# Patient Record
Sex: Female | Born: 1980 | Race: Black or African American | Hispanic: No | Marital: Single | Smoking: Current some day smoker
Health system: Southern US, Community
[De-identification: ages and names within clinical notes are randomized; demographics above are authoritative.]

## PROBLEM LIST (undated history)

## (undated) DIAGNOSIS — E669 Obesity, unspecified: Secondary | ICD-10-CM

---

## 2016-02-12 ENCOUNTER — Encounter (HOSPITAL_COMMUNITY): Payer: Self-pay | Admitting: *Deleted

## 2016-02-12 ENCOUNTER — Emergency Department (HOSPITAL_COMMUNITY)
Admission: EM | Admit: 2016-02-12 | Discharge: 2016-02-12 | Disposition: A | Discharge status: Home / Self Care | Payer: Medicaid Other | Attending: Emergency Medicine | Admitting: Emergency Medicine

## 2016-02-12 ENCOUNTER — Emergency Department (HOSPITAL_COMMUNITY): Payer: Medicaid Other

## 2016-02-12 DIAGNOSIS — Z720 Tobacco use: Secondary | ICD-10-CM

## 2016-02-12 DIAGNOSIS — F17200 Nicotine dependence, unspecified, uncomplicated: Secondary | ICD-10-CM | POA: Diagnosis not present

## 2016-02-12 DIAGNOSIS — Z3202 Encounter for pregnancy test, result negative: Secondary | ICD-10-CM | POA: Diagnosis not present

## 2016-02-12 DIAGNOSIS — J40 Bronchitis, not specified as acute or chronic: Secondary | ICD-10-CM

## 2016-02-12 DIAGNOSIS — R079 Chest pain, unspecified: Secondary | ICD-10-CM | POA: Insufficient documentation

## 2016-02-12 DIAGNOSIS — J209 Acute bronchitis, unspecified: Secondary | ICD-10-CM | POA: Diagnosis not present

## 2016-02-12 LAB — BASIC METABOLIC PANEL
ANION GAP: 7 (ref 5–15)
BUN: 7 mg/dL (ref 6–20)
CO2: 25 mmol/L (ref 22–32)
Calcium: 9.4 mg/dL (ref 8.9–10.3)
Chloride: 105 mmol/L (ref 101–111)
Creatinine, Ser: 0.75 mg/dL (ref 0.44–1.00)
GFR calc Af Amer: >60 mL/min (ref >60)
Glucose, Bld: 111 mg/dL — ABNORMAL HIGH (ref 65–99)
POTASSIUM: 3.8 mmol/L (ref 3.5–5.1)
SODIUM: 137 mmol/L (ref 135–145)

## 2016-02-12 LAB — CBC
HEMATOCRIT: 38.8 % (ref 36.0–46.0)
HEMOGLOBIN: 12 g/dL (ref 12.0–15.0)
MCH: 26.5 pg (ref 26.0–34.0)
MCHC: 30.9 g/dL (ref 30.0–36.0)
MCV: 85.7 fL (ref 78.0–100.0)
Platelets: 231 10*3/uL (ref 150–400)
RBC: 4.53 MIL/uL (ref 3.87–5.11)
RDW: 14.4 % (ref 11.5–15.5)
WBC: 6.9 10*3/uL (ref 4.0–10.5)

## 2016-02-12 LAB — URINALYSIS, ROUTINE W REFLEX MICROSCOPIC
Bilirubin Urine: NEGATIVE
Glucose, UA: NEGATIVE mg/dL
Hgb urine dipstick: NEGATIVE
KETONES UR: NEGATIVE mg/dL
LEUKOCYTES UA: NEGATIVE
NITRITE: NEGATIVE
PROTEIN: NEGATIVE mg/dL
Specific Gravity, Urine: 1.018 (ref 1.005–1.030)
pH: 6.5 (ref 5.0–8.0)

## 2016-02-12 LAB — I-STAT TROPONIN, ED: Troponin i, poc: 0.01 ng/mL (ref 0.00–0.08)

## 2016-02-12 LAB — RAPID URINE DRUG SCREEN, HOSP PERFORMED
Amphetamines: NOT DETECTED
BENZODIAZEPINES: NOT DETECTED
Barbiturates: NOT DETECTED
COCAINE: NOT DETECTED
Opiates: NOT DETECTED
Tetrahydrocannabinol: POSITIVE — AB

## 2016-02-12 LAB — TROPONIN I

## 2016-02-12 LAB — POC URINE PREG, ED: PREG TEST UR: NEGATIVE

## 2016-02-12 MED ORDER — KETOROLAC TROMETHAMINE 10 MG PO TABS: 10.0000 mg | ORAL_TABLET | Freq: Four times a day (QID) | ORAL | Status: DC | PRN

## 2016-02-12 MED ORDER — AZITHROMYCIN 250 MG PO TABS: 250.0000 mg | ORAL_TABLET | Freq: Every day | ORAL | Status: DC

## 2016-02-12 MED ORDER — AZITHROMYCIN 250 MG PO TABS
500.0000 mg | ORAL_TABLET | Freq: Once | ORAL | Status: AC
  Administered 2016-02-12: 500 mg via ORAL
  Filled 2016-02-12: qty 2

## 2016-02-12 NOTE — ED Provider Notes (Signed)
CSN: [id number]     Arrival date & time 02/12/16  1047 History   First MD Initiated Contact with Patient 02/12/16 1049     Chief Complaint  Patient presents with  . Chest Pain   PT HERE VIA EMS DUE TO CP.  THE PT SAID SHE HAS RECENTLY MOVED HERE FROM [place] AND HAS HAD A LOT OF STRESS.  THE PT SAID SHE HAS BEEN SMOKING MORE THAN NL.  THE PT WAS GIVEN ASA BY EMS.  (Consider location/radiation/quality/duration/timing/severity/associated sxs/prior Treatment) Patient is a 35 y.o. female presenting with chest pain. The history is provided by the patient.  Chest Pain Pain location:  R chest Pain radiates to:  R jaw Pain radiates to the back: no   Pain severity:  Moderate Onset quality:  Sudden Progression:  Resolved Chronicity:  New Context: breathing     No past medical history on file. No past surgical history on file. No family history on file. Social History  Substance Use Topics  . Smoking status: Not on file  . Smokeless tobacco: Not on file  . Alcohol Use: Not on file   OB History    No data available     Review of Systems  Cardiovascular: Positive for chest pain.  All other systems reviewed and are negative.     Allergies  Review of patient's allergies indicates no known allergies.  Home Medications   Prior to Admission medications   Medication Sig Start Date End Date Taking? Authorizing Provider  azithromycin (ZITHROMAX Z-PAK) 250 MG tablet Take 1 tablet (250 mg total) by mouth daily. 02/12/16   Jacalyn LefevreJulie Claris Guymon, MD  ketorolac (TORADOL) 10 MG tablet Take 1 tablet (10 mg total) by mouth every 6 (six) hours as needed. 02/12/16   Jacalyn LefevreJulie Madden Garron, MD   BP 107/61 mmHg  Pulse 63  Temp(Src) 98.3 F (36.8 C) (Oral)  Resp 18  Ht 5\' 1"  (1.549 m)  Wt 272 lb (123.378 kg)  BMI 51.42 kg/m2  SpO2 100% Physical Exam  Constitutional: She is oriented to person, place, and time. She appears well-developed and well-nourished.  HENT:  Head: Normocephalic and atraumatic.   Right Ear: External ear normal.  Left Ear: External ear normal.  Nose: Nose normal.  Mouth/Throat: Oropharynx is clear and moist.  Eyes: Conjunctivae and EOM are normal. Pupils are equal, round, and reactive to light.  Neck: Normal range of motion. Neck supple.  Cardiovascular: Normal rate, regular rhythm, normal heart sounds and intact distal pulses.   Pulmonary/Chest: Effort normal and breath sounds normal.  Abdominal: Soft. Bowel sounds are normal.  Musculoskeletal: Normal range of motion.  Neurological: She is alert and oriented to person, place, and time.  Skin: Skin is warm and dry.  Psychiatric: She has a normal mood and affect. Her behavior is normal. Judgment and thought content normal.  Nursing note and vitals reviewed.   ED Course  Procedures (including critical care time) Labs Review Labs Reviewed  BASIC METABOLIC PANEL - Abnormal; Notable for the following:    Glucose, Bld 111 (*)    All other components within normal limits  URINE RAPID DRUG SCREEN, HOSP PERFORMED - Abnormal; Notable for the following:    Tetrahydrocannabinol POSITIVE (*)    All other components within normal limits  CBC  TROPONIN I  URINALYSIS, ROUTINE W REFLEX MICROSCOPIC (NOT [facility])  Rosezena SensorI-STAT TROPOININ, ED  POC URINE PREG, ED    Imaging Review Dg Chest 2 View  02/12/2016  CLINICAL DATA:  Right-sided chest  pain radiating up into neck and jaw 2 weeks. EXAM: CHEST  2 VIEW COMPARISON:  None. FINDINGS: Lungs are adequately inflated without focal consolidation or effusion. Minimal prominence of the central bronchovascular markings. Borderline cardiomegaly. Mild degenerate change of the spine. IMPRESSION: Subtle prominence of the central bronchovascular markings which may be due to an acute bronchitic process or possible mild vascular congestion. Borderline cardiomegaly. Electronically Signed   By: Elberta Fortis M.D.   On: 02/12/2016 12:05   I have personally reviewed and evaluated these images and  lab results as part of my medical decision-making.   EKG Interpretation   Date/Time:  Friday Feb 12 2016 11:12:18 EDT Ventricular Rate:  66 PR Interval:  170 QRS Duration: 98 QT Interval:  415 QTC Calculation: 435 R Axis:   86 Text Interpretation:  Sinus rhythm Low voltage, precordial leads Baseline  wander in lead(s) V2 Confirmed by Robi Mitter MD, Taydem Cavagnaro ([id number]) on 02/12/2016  11:45:16 AM      MDM  PT IS FEELING BETTER.  SHE IS TOLD TO STOP SMOKING AND TO WATCH HER DIET AND EXERCISE.  SHE IS INSTR TO RETURN IF WORSE. Final diagnoses:  Chest pain, unspecified chest pain type  Tobacco abuse  Bronchitis        Jacalyn Lefevre, MD 02/12/16 1229

## 2016-02-12 NOTE — ED Notes (Signed)
[facility] in [facility], per report pt reports R CP radiates to R jaw, pt denies n/v/d & SOB, A&O x4, pt 324 mg ASA, pain free upon EMS arrival & at arrival to ED

## 2016-02-12 NOTE — Discharge Instructions (Signed)
Chest Wall Pain °Chest wall pain is pain in or around the bones and muscles of your chest. Sometimes, an injury causes this pain. Sometimes, the cause may not be known. This pain may take several weeks or longer to get better. °HOME CARE °Pay attention to any changes in your symptoms. Take these actions to help with your pain: °· Rest as told by your doctor. °· Avoid activities that cause pain. Try not to use your chest, belly (abdominal), or side muscles to lift heavy things. °· If directed, apply ice to the painful area: °¨ Put ice in a plastic bag. °¨ Place a towel between your skin and the bag. °¨ Leave the ice on for 20 minutes, 2-3 times per day. °· Take over-the-counter and prescription medicines only as told by your doctor. °· Do not use tobacco products, including cigarettes, chewing tobacco, and e-cigarettes. If you need help quitting, ask your doctor. °· Keep all follow-up visits as told by your doctor. This is important. °GET HELP IF: °· You have a fever. °· Your chest pain gets worse. °· You have new symptoms. °GET HELP RIGHT AWAY IF: °· You feel sick to your stomach (nauseous) or you throw up (vomit). °· You feel sweaty or light-headed. °· You have a cough with phlegm (sputum) or you cough up blood. °· You are short of breath. °  °This information is not intended to replace advice given to you by your health care provider. Make sure you discuss any questions you have with your health care provider. °  °Document Released: 02/22/2008 Document Revised: 05/27/2015 Document Reviewed: 12/01/2014 °Elsevier Interactive Patient Education ©2016 [employer] ° °

## 2016-02-21 ENCOUNTER — Encounter (HOSPITAL_COMMUNITY): Payer: Self-pay | Admitting: Nurse Practitioner

## 2016-02-21 ENCOUNTER — Emergency Department (HOSPITAL_COMMUNITY)
Admission: EM | Admit: 2016-02-21 | Discharge: 2016-02-21 | Disposition: A | Discharge status: Home / Self Care | Payer: Medicaid Other | Attending: Emergency Medicine | Admitting: Emergency Medicine

## 2016-02-21 DIAGNOSIS — L989 Disorder of the skin and subcutaneous tissue, unspecified: Secondary | ICD-10-CM

## 2016-02-21 DIAGNOSIS — Z6841 Body Mass Index (BMI) 40.0 and over, adult: Secondary | ICD-10-CM | POA: Diagnosis not present

## 2016-02-21 DIAGNOSIS — Y999 Unspecified external cause status: Secondary | ICD-10-CM | POA: Insufficient documentation

## 2016-02-21 DIAGNOSIS — S40862A Insect bite (nonvenomous) of left upper arm, initial encounter: Secondary | ICD-10-CM | POA: Diagnosis not present

## 2016-02-21 DIAGNOSIS — R220 Localized swelling, mass and lump, head: Secondary | ICD-10-CM | POA: Insufficient documentation

## 2016-02-21 DIAGNOSIS — Y929 Unspecified place or not applicable: Secondary | ICD-10-CM | POA: Diagnosis not present

## 2016-02-21 DIAGNOSIS — W57XXXA Bitten or stung by nonvenomous insect and other nonvenomous arthropods, initial encounter: Secondary | ICD-10-CM | POA: Diagnosis not present

## 2016-02-21 DIAGNOSIS — E669 Obesity, unspecified: Secondary | ICD-10-CM | POA: Insufficient documentation

## 2016-02-21 DIAGNOSIS — F17210 Nicotine dependence, cigarettes, uncomplicated: Secondary | ICD-10-CM | POA: Insufficient documentation

## 2016-02-21 DIAGNOSIS — Y939 Activity, unspecified: Secondary | ICD-10-CM | POA: Insufficient documentation

## 2016-02-21 DIAGNOSIS — Z79899 Other long term (current) drug therapy: Secondary | ICD-10-CM | POA: Insufficient documentation

## 2016-02-21 DIAGNOSIS — R21 Rash and other nonspecific skin eruption: Secondary | ICD-10-CM | POA: Diagnosis present

## 2016-02-21 MED ORDER — DIPHENHYDRAMINE HCL 25 MG PO TABS: 25.0000 mg | ORAL_TABLET | Freq: Four times a day (QID) | ORAL | Status: DC

## 2016-02-21 MED ORDER — FAMOTIDINE 20 MG PO TABS
20.0000 mg | ORAL_TABLET | Freq: Once | ORAL | Status: AC
  Administered 2016-02-21: 20 mg via ORAL
  Filled 2016-02-21: qty 1

## 2016-02-21 MED ORDER — DIPHENHYDRAMINE HCL 25 MG PO CAPS
50.0000 mg | ORAL_CAPSULE | Freq: Once | ORAL | Status: AC
  Administered 2016-02-21: 50 mg via ORAL
  Filled 2016-02-21: qty 2

## 2016-02-21 MED ORDER — PREDNISONE 10 MG (21) PO TBPK
10.0000 mg | ORAL_TABLET | Freq: Every day | ORAL | Status: DC
Start: 2016-02-21 — End: 2016-05-04

## 2016-02-21 MED ORDER — EPINEPHRINE 0.3 MG/0.3ML IJ SOAJ: 0.3000 mg | Freq: Once | INTRAMUSCULAR | Status: AC

## 2016-02-21 MED ORDER — FAMOTIDINE 20 MG PO TABS: 20.0000 mg | ORAL_TABLET | Freq: Two times a day (BID) | ORAL | Status: AC

## 2016-02-21 MED ORDER — PREDNISONE 20 MG PO TABS
60.0000 mg | ORAL_TABLET | Freq: Once | ORAL | Status: AC
  Administered 2016-02-21: 60 mg via ORAL
  Filled 2016-02-21: qty 3

## 2016-02-21 NOTE — ED Provider Notes (Signed)
CSN: [id number]     Arrival date & time 02/21/16  1645 History   First MD Initiated Contact with Patient 02/21/16 1705     Chief Complaint  Patient presents with  . Skin Problem   HPI  Felicia Yoder is a 35 y.o. female presenting with a 3 day history of painful tongue and a "rash" on her arm. She was seen in this ED on 5/26, was given a prescription for azithromycin. She began taking the azithromycin that day, had tongue swelling and noticed a red bite mark on her inner upper left arm that she describes as painful and itchy. She denies fevers, chills, chest pain, shortness of breath, signs of her throat closing, mucosal sloughing, nausea, vomiting, abdominal pain, change in bowel or bladder habits.  History reviewed. No pertinent past medical history. History reviewed. No pertinent past surgical history. History reviewed. No pertinent family history. Social History  Substance Use Topics  . Smoking status: Current Every Day Smoker -- 1.00 packs/day    Types: Cigarettes  . Smokeless tobacco: None  . Alcohol Use: No   OB History    No data available     Review of Systems  Ten systems are reviewed and are negative for acute change except as noted in the HPI  Allergies  Azithromycin  Home Medications   Prior to Admission medications   Medication Sig Start Date End Date Taking? Authorizing Provider  azithromycin (ZITHROMAX Z-PAK) 250 MG tablet Take 1 tablet (250 mg total) by mouth daily. 02/12/16   Jacalyn Lefevre, MD  EPINEPHrine (EPIPEN 2-PAK) 0.3 mg/0.3 mL IJ SOAJ injection Inject 0.3 mLs (0.3 mg total) into the muscle once. 02/21/16   Melton Krebs, PA-C  ketorolac (TORADOL) 10 MG tablet Take 1 tablet (10 mg total) by mouth every 6 (six) hours as needed. 02/12/16   Jacalyn Lefevre, MD   BP 121/75 mmHg  Pulse 88  Temp(Src) 97.9 F (36.6 C) (Oral)  Resp 16  Ht  (1.549 m)  Wt 122.641 kg  BMI 51.11 kg/m2  SpO2 96% Physical Exam  Constitutional: She appears  well-developed and well-nourished. No distress.  Obese  HENT:  Head: Normocephalic and atraumatic.  Mouth/Throat: Oropharynx is clear and moist. No oropharyngeal exudate.  Eyes: Conjunctivae are normal. Pupils are equal, round, and reactive to light. Right eye exhibits no discharge. Left eye exhibits no discharge. No scleral icterus.  Neck: No tracheal deviation present.  Cardiovascular: Normal rate, regular rhythm, normal heart sounds and intact distal pulses.  Exam reveals no gallop and no friction rub.   No murmur heard. Pulmonary/Chest: Effort normal and breath sounds normal. No respiratory distress. She has no wheezes. She has no rales. She exhibits no tenderness.  Abdominal: Soft. Bowel sounds are normal. She exhibits no distension and no mass. There is no tenderness. There is no rebound and no guarding.  Musculoskeletal: She exhibits no edema.  Lymphadenopathy:    She has no cervical adenopathy.  Neurological: She is alert. Coordination normal.  Skin: Skin is warm and dry. No rash noted. She is not diaphoretic. There is erythema.  1 cm of erythema at left medial upper arm. No fluctuance, purulent drainage. Lesion consistent with healing insect bite  Psychiatric: She has a normal mood and affect. Her behavior is normal.  Nursing note and vitals reviewed.   ED Course  Procedures   MDM   Final diagnoses:  Tongue swelling  Skin lesion   Patient with painful and swollen tongue for 2-3 days.  It has not changed in size and she denies any feelings of shortness of breath. No mucosal sloughing on exam.  Patient denies any difficulty breathing or swallowing.  Pt has a patent airway without stridor and is handling secretions without difficulty; no angioedema. No blisters, no pustules, no warmth, no draining sinus tracts, no superficial abscesses, no bullous impetigo, no vesicles, no desquamation, no target lesions with dusky purpura or a central bulla. Not tender to touch. No concern for  superimposed infection. No concern for SJS, TEN, TSS, tick borne illness, syphilis or other life-threatening condition. Will discharge home with short course of steroids, pepcid and recommend Benadryl as needed for pruritis.  Patient re-evaluated prior to dc, is hemodynamically stable, in no respiratory distress, and denies the feeling of throat closing. Pt has been advised to take OTC benadryl & return to the ED if they have a mod-severe allergic rxn (s/s including throat closing, difficulty breathing, swelling of lips face or tongue). Pt is to follow up with their PCP. Pt is agreeable with plan & verbalizes understanding. Patient given an EpiPen at discharge as well as a coupon.   Melton KrebsSamantha Nicole Deshanta Lady, PA-C 02/21/16 1731  Gerhard Munchobert Lockwood, MD 02/21/16 [id number]

## 2016-02-21 NOTE — ED Notes (Addendum)
Pt c/o painful rash to tongue, onset after she was treated with antibiotics for bronchitis. She describes the rash as burning. She also c/o red bite mark to inner upper arm x 2-3 days that is painful and itchy. She is alert and breathing easily, denies any pain now

## 2016-02-21 NOTE — Discharge Instructions (Signed)
Ms. Nicola PoliceRuby Azzarello,  Nice meeting you! Please follow-up with your primary care provider. Return to the emergency department if you develop chest pain, shortness of breath, throat closing/itching, new/worsening symptoms. Feel better soon!  S. Lane HackerNicole Bentzion Dauria, PA-C

## 2016-02-21 NOTE — ED Notes (Signed)
Bug bite to Lt upper arm. Site is dry and intact.

## 2016-02-21 NOTE — ED Notes (Signed)
Declined W/C at D/C and was escorted to lobby by RN. 

## 2016-02-25 ENCOUNTER — Emergency Department (HOSPITAL_COMMUNITY)
Admission: EM | Admit: 2016-02-25 | Discharge: 2016-02-25 | Disposition: A | Discharge status: Home / Self Care | Payer: Medicaid Other | Attending: Emergency Medicine | Admitting: Emergency Medicine

## 2016-02-25 ENCOUNTER — Encounter (HOSPITAL_COMMUNITY): Payer: Self-pay | Admitting: Emergency Medicine

## 2016-02-25 DIAGNOSIS — J028 Acute pharyngitis due to other specified organisms: Secondary | ICD-10-CM | POA: Diagnosis not present

## 2016-02-25 DIAGNOSIS — F17210 Nicotine dependence, cigarettes, uncomplicated: Secondary | ICD-10-CM | POA: Insufficient documentation

## 2016-02-25 DIAGNOSIS — R52 Pain, unspecified: Secondary | ICD-10-CM

## 2016-02-25 DIAGNOSIS — Z79899 Other long term (current) drug therapy: Secondary | ICD-10-CM | POA: Diagnosis not present

## 2016-02-25 DIAGNOSIS — J029 Acute pharyngitis, unspecified: Secondary | ICD-10-CM | POA: Diagnosis present

## 2016-02-25 DIAGNOSIS — R197 Diarrhea, unspecified: Secondary | ICD-10-CM

## 2016-02-25 DIAGNOSIS — B9789 Other viral agents as the cause of diseases classified elsewhere: Secondary | ICD-10-CM | POA: Diagnosis not present

## 2016-02-25 LAB — RAPID STREP SCREEN (MED CTR MEBANE ONLY): STREPTOCOCCUS, GROUP A SCREEN (DIRECT): NEGATIVE

## 2016-02-25 NOTE — ED Notes (Signed)
Pt c/o sore throat, body aches and diarrhea since last night.

## 2016-02-25 NOTE — ED Provider Notes (Signed)
CSN: [id number]     Arrival date & time 02/25/16  1506 History  By signing my name below, I, Felicia Yoder, attest that this documentation has been prepared under the direction and in the presence of Kerrie Buffalo, NP. Electronically Signed: Tanda Yoder, ED Scribe. 02/25/2016. 3:30 PM.  Chief Complaint  Patient presents with  . Generalized Body Aches   Patient is a 35 y.o. female presenting with pharyngitis. The history is provided by the patient. No language interpreter was used.  Sore Throat This is a new problem. The current episode started 12 to 24 hours ago. The problem occurs rarely. The problem has not changed since onset.The symptoms are aggravated by swallowing. She has tried nothing for the symptoms.   HPI Comments: Felicia Yoder is a 35 y.o. female who presents to the Emergency Department complaining of a 10/10, achy, sore throat with onset last night. Pain is exacerbated with swallowing but pt is able to tolerate own secretions. She reports associated diarrhea with loose stool and body aches. She reports she has never had similar symptoms. Pt has not taken anything for her symptoms. She denies fever, chill, nausea, vomiting, and constipation.  Per chart review: Pt was seen in ED on 02/12/2016 (approximately 2 weeks ago) for chest pain. She had an x ray done which showed findings of bronchitis. Pt was discharged home with prescriptions for Toradol and Zithromax. She was seen again in the ED on 02/21/2016 for tongue swelling that began a couple of days after taking the Zithromax. Pt was unsure if the antibiotic was causing reaction. She was given Pepcid, Benadryl, Prednisone, and an Epi pen on discharge but did not fill these prescriptions.   History reviewed. No pertinent past medical history. History reviewed. No pertinent past surgical history. No family history on file. Social History  Substance Use Topics  . Smoking status: Current Every Day Smoker -- 1.00 packs/day    Types:  Cigarettes  . Smokeless tobacco: None  . Alcohol Use: No   OB History    No data available     Review of Systems  Constitutional: Negative for fever and chills.  HENT: Positive for sore throat.   Gastrointestinal: Positive for diarrhea. Negative for nausea, vomiting and constipation.  All other systems reviewed and are negative.  Allergies  Azithromycin  Home Medications   Prior to Admission medications   Medication Sig Start Date End Date Taking? Authorizing Provider  azithromycin (ZITHROMAX Z-PAK) 250 MG tablet Take 1 tablet (250 mg total) by mouth daily. 02/12/16   Jacalyn Lefevre, MD  diphenhydrAMINE (BENADRYL) 25 MG tablet Take 1 tablet (25 mg total) by mouth every 6 (six) hours. 02/21/16   Melton Krebs, PA-C  EPINEPHrine (EPIPEN 2-PAK) 0.3 mg/0.3 mL IJ SOAJ injection Inject 0.3 mLs (0.3 mg total) into the muscle once. 02/21/16   Melton Krebs, PA-C  famotidine (PEPCID) 20 MG tablet Take 1 tablet (20 mg total) by mouth 2 (two) times daily. 02/21/16   Melton Krebs, PA-C  ketorolac (TORADOL) 10 MG tablet Take 1 tablet (10 mg total) by mouth every 6 (six) hours as needed. 02/12/16   Jacalyn Lefevre, MD  predniSONE (STERAPRED UNI-PAK 21 TAB) 10 MG (21) TBPK tablet Take 1 tablet (10 mg total) by mouth daily. Take 6 tabs by mouth daily  for 2 days, then 5 tabs for 2 days, then 4 tabs for 2 days, then 3 tabs for 2 days, 2 tabs for 2 days, then 1 tab by mouth daily  for 2 days 02/21/16   Melton KrebsSamantha Nicole Riley, PA-C   Triage Vitals: BP 124/61 mmHg  Pulse 85  Temp(Src) 98.2 F (36.8 C) (Oral)  Resp 16  Ht 5\' 1"  (1.549 m)  Wt 270 lb (122.471 kg)  BMI 51.04 kg/m2  SpO2 99%  LMP 01/21/2016   Physical Exam  Constitutional: She is oriented to person, place, and time. She appears well-developed and well-nourished. No distress.  HENT:  Head: Normocephalic and atraumatic.  Right Ear: Tympanic membrane normal.  Left Ear: Tympanic membrane normal.  Nose: Right sinus  exhibits no maxillary sinus tenderness and no frontal sinus tenderness. Left sinus exhibits no maxillary sinus tenderness and no frontal sinus tenderness.  Mouth/Throat: Uvula is midline. Posterior oropharyngeal erythema present. No posterior oropharyngeal edema.  No sinus tenderness No pharyngeal edema Mild pharyngeal erythema  Eyes: Conjunctivae and EOM are normal. Pupils are equal, round, and reactive to light.  Good ocular movement. Sclera clear.   Neck: Neck supple. No tracheal deviation present.  No cervical nodes palpated  Cardiovascular: Normal rate, regular rhythm and normal heart sounds.   Pulmonary/Chest: Effort normal and breath sounds normal. No respiratory distress. She has no wheezes. She has no rales.  Abdominal: Soft. There is no tenderness.  No CVA tenderness  Musculoskeletal: Normal range of motion.  Lymphadenopathy:    She has no cervical adenopathy.  Neurological: She is alert and oriented to person, place, and time.  Skin: Skin is warm and dry.  Psychiatric: She has a normal mood and affect. Her behavior is normal.  Nursing note and vitals reviewed.   ED Course  Procedures (including critical care time) DIAGNOSTIC STUDIES: Oxygen Saturation is 99% on RA, normal by my interpretation.    COORDINATION OF CARE: 3:24 PM-Discussed treatment plan which includes rapid strep test with pt at bedside and pt agreed to plan.   Labs Review Labs Reviewed  RAPID STREP SCREEN (NOT AT [facility])  CULTURE, GROUP A STREP [facility])    Imaging Review No results found. I have personally reviewed and evaluated the lab results as part of my medical decision-making.   MDM   Final diagnoses:  Viral sore throat  Body aches  Diarrhea, unspecified type   Pt with negative strep. Diagnosis of viral pharyngitis. No abx indicated at this time. Discussed that results of strep culture are pending and patient will be informed if positive result and abx will be called in at that time.  Discharge with symptomatic tx. No evidence of dehydration. Pt is tolerating secretions. Presentation not concerning for peritonsillar abscess or spread of infection to deep spaces of the throat; patent airway. Specific return precautions discussed. Recommended PCP follow up. Pt appears safe for discharge.  I personally performed the services described in this documentation, which was scribed in my presence. The recorded information has been reviewed and is accurate.   McCauslandHope M Izsak Meir, NP 02/26/16 1336  Geoffery Lyonsouglas Delo, MD 02/26/16 [phone]

## 2016-02-25 NOTE — Discharge Instructions (Signed)
Your strep screen is negative. We will send it for culture. We will only call if the culture is positive.  Use salt water gargles for the sore throat and take tylenol and ibuprofen for pain. Follow up with your doctor. Stay on clear liquids today and then advance to the diet for diarrhea.

## 2016-02-27 LAB — CULTURE, GROUP A STREP (THRC)

## 2016-03-09 ENCOUNTER — Encounter (HOSPITAL_COMMUNITY): Payer: Self-pay | Admitting: Emergency Medicine

## 2016-03-09 ENCOUNTER — Emergency Department (HOSPITAL_COMMUNITY)
Admission: EM | Admit: 2016-03-09 | Discharge: 2016-03-09 | Disposition: A | Discharge status: Home / Self Care | Payer: Medicaid Other | Attending: Emergency Medicine | Admitting: Emergency Medicine

## 2016-03-09 DIAGNOSIS — J029 Acute pharyngitis, unspecified: Secondary | ICD-10-CM | POA: Insufficient documentation

## 2016-03-09 DIAGNOSIS — Z79899 Other long term (current) drug therapy: Secondary | ICD-10-CM | POA: Diagnosis not present

## 2016-03-09 DIAGNOSIS — F17210 Nicotine dependence, cigarettes, uncomplicated: Secondary | ICD-10-CM | POA: Diagnosis not present

## 2016-03-09 LAB — RAPID STREP SCREEN (MED CTR MEBANE ONLY): STREPTOCOCCUS, GROUP A SCREEN (DIRECT): NEGATIVE

## 2016-03-09 MED ORDER — CHLORHEXIDINE GLUCONATE 0.12 % MT SOLN: 15.0000 mL | Freq: Two times a day (BID) | OROMUCOSAL | Status: DC

## 2016-03-09 MED ORDER — KETOROLAC TROMETHAMINE 10 MG PO TABS: 10.0000 mg | ORAL_TABLET | Freq: Four times a day (QID) | ORAL | Status: DC | PRN

## 2016-03-09 MED ORDER — IBUPROFEN 400 MG PO TABS
800.0000 mg | ORAL_TABLET | Freq: Once | ORAL | Status: AC
  Administered 2016-03-09: 800 mg via ORAL
  Filled 2016-03-09: qty 2

## 2016-03-09 NOTE — ED Notes (Signed)
Pt c/o of sore throat since yesterday worse this afternoon. Pt states hurts to swallow and talk.

## 2016-03-09 NOTE — ED Provider Notes (Signed)
CSN: [id number]     Arrival date & time 03/09/16  2013 History  By signing my name below, I, Soijett Blue, attest that this documentation has been prepared under the direction and in the presence of Fayrene HelperBowie Ganon Demasi, PA-C Electronically Signed: Soijett Blue, ED Scribe. 03/09/2016. 9:31 PM.   Chief Complaint  Patient presents with  . Sore Throat      The history is provided by the patient. No language interpreter was used.    HPI Comments: Felicia Yoder is a 35 y.o. female who presents to the Emergency Department complaining of constant, moderate, left sided sore throat onset yesterday worsening this afternoon. Pt reports that her sore throat is worsened with talking and swallowing. Denies any alleviating factors. She states that she is having associated symptoms of painful swallowing. She states that she has not tried any medications for the relief for her symptoms. She denies rhinorrhea, congestion, trouble swallowing, ear pain, decreased hearing, drooling, and any other symptoms. Pt is allergic to zithromax.    History reviewed. No pertinent past medical history. History reviewed. No pertinent past surgical history. No family history on file. Social History  Substance Use Topics  . Smoking status: Current Every Day Smoker -- 1.00 packs/day    Types: Cigarettes  . Smokeless tobacco: None  . Alcohol Use: No   OB History    No data available     Review of Systems  Constitutional: Negative for fever and chills.  HENT: Positive for sore throat. Negative for congestion, drooling, ear pain, hearing loss, rhinorrhea and trouble swallowing.        Painful swallowing      Allergies  Azithromycin  Home Medications   Prior to Admission medications   Medication Sig Start Date End Date Taking? Authorizing Provider  azithromycin (ZITHROMAX Z-PAK) 250 MG tablet Take 1 tablet (250 mg total) by mouth daily. 02/12/16   Jacalyn LefevreJulie Haviland, MD  diphenhydrAMINE (BENADRYL) 25 MG tablet Take 1 tablet  (25 mg total) by mouth every 6 (six) hours. 02/21/16   Melton KrebsSamantha Nicole Riley, PA-C  EPINEPHrine (EPIPEN 2-PAK) 0.3 mg/0.3 mL IJ SOAJ injection Inject 0.3 mLs (0.3 mg total) into the muscle once. 02/21/16   Melton KrebsSamantha Nicole Riley, PA-C  famotidine (PEPCID) 20 MG tablet Take 1 tablet (20 mg total) by mouth 2 (two) times daily. 02/21/16   Melton KrebsSamantha Nicole Riley, PA-C  ketorolac (TORADOL) 10 MG tablet Take 1 tablet (10 mg total) by mouth every 6 (six) hours as needed. 02/12/16   Jacalyn LefevreJulie Haviland, MD  predniSONE (STERAPRED UNI-PAK 21 TAB) 10 MG (21) TBPK tablet Take 1 tablet (10 mg total) by mouth daily. Take 6 tabs by mouth daily  for 2 days, then 5 tabs for 2 days, then 4 tabs for 2 days, then 3 tabs for 2 days, 2 tabs for 2 days, then 1 tab by mouth daily for 2 days 02/21/16   Melton KrebsSamantha Nicole Riley, PA-C   BP 110/71 mmHg  Pulse 100  Temp(Src) 98.4 F (36.9 C) (Oral)  Resp 20  Ht 5\' 1"  (1.549 m)  Wt 276 lb (125.193 kg)  BMI 52.18 kg/m2  SpO2 97%  LMP 02/24/2016 Physical Exam  Constitutional: She is oriented to person, place, and time. She appears well-developed and well-nourished. No distress.  HENT:  Head: Normocephalic and atraumatic.  Right Ear: Ear canal normal. Tympanic membrane is erythematous.  Left Ear: Tympanic membrane and ear canal normal.  Mouth/Throat: Uvula is midline, oropharynx is clear and moist and mucous membranes are normal.  No trismus in the jaw. No oropharyngeal exudate or posterior oropharyngeal edema.  Left TM nl. Right TM mildly erythematous. Nl canals bilaterally. No tonsillar enlargement or exudate. No trismus. Uvula midline. Tenderness noted to left mandibular region. No cervical LAD.   Eyes: EOM are normal.  Neck: Neck supple.  Cardiovascular: Normal rate.   Pulmonary/Chest: Effort normal. No respiratory distress.  Abdominal: She exhibits no distension.  Musculoskeletal: Normal range of motion.  Neurological: She is alert and oriented to person, place, and time.  Skin:  Skin is warm and dry.  Psychiatric: She has a normal mood and affect. Her behavior is normal.  Nursing note and vitals reviewed.   ED Course  Procedures (including critical care time) DIAGNOSTIC STUDIES: Oxygen Saturation is 97% on RA, nl by my interpretation.    COORDINATION OF CARE: 9:28 PM Discussed treatment plan with pt at bedside which includes rapid strep screen and culture, referral to ENT and pt agreed to plan.    Labs Review Labs Reviewed  RAPID STREP SCREEN (NOT AT [facility])  CULTURE, GROUP A STREP [facility])   I have personally reviewed and evaluated these lab results as part of my medical decision-making.   MDM   Final diagnoses:  Pharyngitis    Pt with negative strep. Diagnosis of viral pharyngitis. No abx indicated at this time. Discussed that results of strep culture are pending and patient will be informed if positive result and abx will be called in at that time. Will discharge with toradol and peridex and advise use of symptomatic tx. No evidence of dehydration. Pt is tolerating secretions. Presentation not concerning for peritonsillar abscess or spread of infection to deep spaces of the throat; patent airway. Referral and follow up with ENT if symptoms persist. Specific return precautions discussed. Recommended PCP follow up. Pt appears safe for discharge.  I personally performed the services described in this documentation, which was scribed in my presence. The recorded information has been reviewed and is accurate.      Fayrene Helper, PA-C 03/09/16 2153  Arby Barrette, MD 03/09/16 2325

## 2016-03-09 NOTE — Discharge Instructions (Signed)

## 2016-03-12 LAB — CULTURE, GROUP A STREP (THRC)

## 2016-05-04 ENCOUNTER — Encounter (HOSPITAL_COMMUNITY): Payer: Self-pay | Admitting: *Deleted

## 2016-05-04 ENCOUNTER — Emergency Department (HOSPITAL_COMMUNITY)
Admission: EM | Admit: 2016-05-04 | Discharge: 2016-05-04 | Disposition: A | Discharge status: Home / Self Care | Payer: Medicaid Other | Attending: Emergency Medicine | Admitting: Emergency Medicine

## 2016-05-04 DIAGNOSIS — F17210 Nicotine dependence, cigarettes, uncomplicated: Secondary | ICD-10-CM | POA: Insufficient documentation

## 2016-05-04 DIAGNOSIS — M545 Low back pain, unspecified: Secondary | ICD-10-CM

## 2016-05-04 DIAGNOSIS — M7918 Myalgia, other site: Secondary | ICD-10-CM

## 2016-05-04 DIAGNOSIS — S060X0D Concussion without loss of consciousness, subsequent encounter: Secondary | ICD-10-CM | POA: Insufficient documentation

## 2016-05-04 DIAGNOSIS — R110 Nausea: Secondary | ICD-10-CM

## 2016-05-04 DIAGNOSIS — S3992XD Unspecified injury of lower back, subsequent encounter: Secondary | ICD-10-CM | POA: Diagnosis present

## 2016-05-04 DIAGNOSIS — S39012D Strain of muscle, fascia and tendon of lower back, subsequent encounter: Secondary | ICD-10-CM | POA: Insufficient documentation

## 2016-05-04 HISTORY — DX: Obesity, unspecified: E66.9

## 2016-05-04 LAB — I-STAT BETA HCG BLOOD, ED (MC, WL, AP ONLY)

## 2016-05-04 MED ORDER — METOCLOPRAMIDE HCL 5 MG/ML IJ SOLN
10.0000 mg | Freq: Once | INTRAMUSCULAR | Status: AC
  Administered 2016-05-04: 10 mg via INTRAVENOUS
  Filled 2016-05-04: qty 2

## 2016-05-04 MED ORDER — OXYCODONE-ACETAMINOPHEN 5-325 MG PO TABS
1.0000 | ORAL_TABLET | ORAL | Status: AC | PRN
  Administered 2016-05-04 (×2): 1 via ORAL
  Filled 2016-05-04: qty 1

## 2016-05-04 MED ORDER — SODIUM CHLORIDE 0.9 % IV BOLUS (SEPSIS)
1000.0000 mL | Freq: Once | INTRAVENOUS | Status: AC
  Administered 2016-05-04: 1000 mL via INTRAVENOUS

## 2016-05-04 MED ORDER — OXYCODONE-ACETAMINOPHEN 5-325 MG PO TABS
ORAL_TABLET | ORAL | Status: AC
  Administered 2016-05-04: 1 via ORAL
  Filled 2016-05-04: qty 1

## 2016-05-04 MED ORDER — CYCLOBENZAPRINE HCL 10 MG PO TABS
10.0000 mg | ORAL_TABLET | Freq: Once | ORAL | Status: AC
  Administered 2016-05-04: 10 mg via ORAL
  Filled 2016-05-04: qty 1

## 2016-05-04 MED ORDER — DIPHENHYDRAMINE HCL 50 MG/ML IJ SOLN
25.0000 mg | Freq: Once | INTRAMUSCULAR | Status: AC
  Administered 2016-05-04: 25 mg via INTRAVENOUS
  Filled 2016-05-04: qty 1

## 2016-05-04 MED ORDER — KETOROLAC TROMETHAMINE 30 MG/ML IJ SOLN
30.0000 mg | Freq: Once | INTRAMUSCULAR | Status: AC
  Administered 2016-05-04: 30 mg via INTRAVENOUS
  Filled 2016-05-04: qty 1

## 2016-05-04 MED ORDER — METOCLOPRAMIDE HCL 10 MG PO TABS: 10.0000 mg | ORAL_TABLET | Freq: Four times a day (QID) | ORAL | 0 refills | Status: DC | PRN

## 2016-05-04 NOTE — ED Notes (Signed)
Pt walked to room from waiting. Assisted into gown. Pt tolerated well.

## 2016-05-04 NOTE — Discharge Instructions (Signed)
Take naprosyn (given at last ER visit) as directed for inflammation and pain with tylenol for breakthrough pain and flexeril (from other ER visit) for muscle relaxation. Do not drive or operate machinery with muscle relaxant use. Use heat to areas of soreness except your head on which you should use ice, no more than 20 minutes at a time every hour for each.  For your concussion, Get plenty of rest, use ice on your head. Stay in a quiet, not simulating, dark environment. No TV, computer use, video games, or cell phone use until headache is resolved completely. Follow Up with primary care physician in 3-4 days if headache persists.  Return to the emergency department if patient becomes lethargic, begins vomiting or other change in mental status. Expect to be sore for the next few days and follow up with primary care physician for recheck of ongoing symptoms in the next 1-2 weeks. Return to ER for emergent changing or worsening of symptoms.
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
## 2016-05-10 ENCOUNTER — Emergency Department (HOSPITAL_COMMUNITY)
Admission: EM | Admit: 2016-05-10 | Discharge: 2016-05-10 | Disposition: A | Discharge status: Home / Self Care | Payer: Medicaid Other | Attending: Emergency Medicine | Admitting: Emergency Medicine

## 2016-05-10 ENCOUNTER — Encounter (HOSPITAL_COMMUNITY): Payer: Self-pay | Admitting: Emergency Medicine

## 2016-05-10 ENCOUNTER — Emergency Department (HOSPITAL_COMMUNITY): Payer: Medicaid Other

## 2016-05-10 DIAGNOSIS — R519 Headache, unspecified: Secondary | ICD-10-CM

## 2016-05-10 DIAGNOSIS — R51 Headache: Secondary | ICD-10-CM | POA: Diagnosis present

## 2016-05-10 DIAGNOSIS — R112 Nausea with vomiting, unspecified: Secondary | ICD-10-CM | POA: Diagnosis not present

## 2016-05-10 DIAGNOSIS — F17210 Nicotine dependence, cigarettes, uncomplicated: Secondary | ICD-10-CM | POA: Insufficient documentation

## 2016-05-10 LAB — CBC
HEMATOCRIT: 45 % (ref 36.0–46.0)
HEMOGLOBIN: 14.6 g/dL (ref 12.0–15.0)
MCH: 27.9 pg (ref 26.0–34.0)
MCHC: 32.4 g/dL (ref 30.0–36.0)
MCV: 85.9 fL (ref 78.0–100.0)
Platelets: 217 10*3/uL (ref 150–400)
RBC: 5.24 MIL/uL — ABNORMAL HIGH (ref 3.87–5.11)
RDW: 14.4 % (ref 11.5–15.5)
WBC: 8 10*3/uL (ref 4.0–10.5)

## 2016-05-10 LAB — COMPREHENSIVE METABOLIC PANEL
ALBUMIN: 3.1 g/dL — AB (ref 3.5–5.0)
ALT: 20 U/L (ref 14–54)
ANION GAP: 6 (ref 5–15)
AST: 20 U/L (ref 15–41)
Alkaline Phosphatase: 52 U/L (ref 38–126)
BUN: 7 mg/dL (ref 6–20)
CHLORIDE: 109 mmol/L (ref 101–111)
CO2: 22 mmol/L (ref 22–32)
Calcium: 8.2 mg/dL — ABNORMAL LOW (ref 8.9–10.3)
Creatinine, Ser: 0.62 mg/dL (ref 0.44–1.00)
GFR calc Af Amer: >60 mL/min (ref >60)
Glucose, Bld: 111 mg/dL — ABNORMAL HIGH (ref 65–99)
POTASSIUM: 3.5 mmol/L (ref 3.5–5.1)
Sodium: 137 mmol/L (ref 135–145)
Total Bilirubin: 0.5 mg/dL (ref 0.3–1.2)
Total Protein: 5.7 g/dL — ABNORMAL LOW (ref 6.5–8.1)

## 2016-05-10 LAB — I-STAT BETA HCG BLOOD, ED (MC, WL, AP ONLY): I-stat hCG, quantitative: <5 m[IU]/mL (ref <5)

## 2016-05-10 LAB — LIPASE, BLOOD: LIPASE: 22 U/L (ref 11–51)

## 2016-05-10 MED ORDER — ONDANSETRON 4 MG PO TBDP
ORAL_TABLET | ORAL | Status: AC
  Filled 2016-05-10: qty 1

## 2016-05-10 MED ORDER — ONDANSETRON 4 MG PO TBDP
4.0000 mg | ORAL_TABLET | Freq: Once | ORAL | Status: AC | PRN
  Administered 2016-05-10: 4 mg via ORAL

## 2016-05-10 MED ORDER — SODIUM CHLORIDE 0.9 % IV BOLUS (SEPSIS)
1000.0000 mL | Freq: Once | INTRAVENOUS | Status: AC
  Administered 2016-05-10: 1000 mL via INTRAVENOUS

## 2016-05-10 MED ORDER — DIPHENHYDRAMINE HCL 50 MG/ML IJ SOLN
25.0000 mg | Freq: Once | INTRAMUSCULAR | Status: AC
  Administered 2016-05-10: 25 mg via INTRAVENOUS
  Filled 2016-05-10: qty 1

## 2016-05-10 MED ORDER — KETOROLAC TROMETHAMINE 15 MG/ML IJ SOLN
15.0000 mg | Freq: Once | INTRAMUSCULAR | Status: AC
  Administered 2016-05-10: 15 mg via INTRAVENOUS
  Filled 2016-05-10: qty 1

## 2016-05-10 MED ORDER — METOCLOPRAMIDE HCL 5 MG/ML IJ SOLN
10.0000 mg | Freq: Once | INTRAMUSCULAR | Status: AC
  Administered 2016-05-10: 10 mg via INTRAVENOUS
  Filled 2016-05-10: qty 2
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
## 2016-05-10 NOTE — Discharge Instructions (Signed)
You were seen today for headache. They CT scan of your head was negative for any acute abnormalities. Take over-the-counter ibuprofen as needed for headache. Limit the use of screens such as computers, telephones, television, reading as this can exacerbate your headache. Be sure to stay well hydrated by drinking plenty of water, at least eight 8oz glasses of water per day. Follow-up with your primary care provider or the [facility] within 2-3 days if your symptoms are not improving.  Return to the emergency department if you experience worsening or uncontrolled headache, uncontrolled vomiting, dizziness, you pass out, visual changes, fever, neck pain, neck stiffness, abdominal pain or any other concerning symptoms.
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
## 2016-05-10 NOTE — ED Notes (Signed)
This RN with E. Meeks, NT, assisted pt to bedside commode.  Pt reported sensation of room spinning, gait noted to be unsteady.  PA made aware.

## 2016-05-17 ENCOUNTER — Encounter (HOSPITAL_COMMUNITY): Payer: Self-pay | Admitting: Emergency Medicine

## 2016-05-17 ENCOUNTER — Emergency Department (HOSPITAL_COMMUNITY)
Admission: EM | Admit: 2016-05-17 | Discharge: 2016-05-17 | Disposition: A | Discharge status: Home / Self Care | Payer: Medicaid Other | Attending: Emergency Medicine | Admitting: Emergency Medicine

## 2016-05-17 ENCOUNTER — Emergency Department (HOSPITAL_COMMUNITY): Payer: Medicaid Other

## 2016-05-17 DIAGNOSIS — F0781 Postconcussional syndrome: Secondary | ICD-10-CM | POA: Insufficient documentation

## 2016-05-17 DIAGNOSIS — Y999 Unspecified external cause status: Secondary | ICD-10-CM | POA: Diagnosis not present

## 2016-05-17 DIAGNOSIS — Y939 Activity, unspecified: Secondary | ICD-10-CM | POA: Diagnosis not present

## 2016-05-17 DIAGNOSIS — F17210 Nicotine dependence, cigarettes, uncomplicated: Secondary | ICD-10-CM | POA: Diagnosis not present

## 2016-05-17 DIAGNOSIS — Y92410 Unspecified street and highway as the place of occurrence of the external cause: Secondary | ICD-10-CM | POA: Diagnosis not present

## 2016-05-17 DIAGNOSIS — M542 Cervicalgia: Secondary | ICD-10-CM | POA: Diagnosis not present

## 2016-05-17 MED ORDER — KETOROLAC TROMETHAMINE 15 MG/ML IJ SOLN
15.0000 mg | Freq: Once | INTRAMUSCULAR | Status: AC
  Administered 2016-05-17: 15 mg via INTRAVENOUS
  Filled 2016-05-17: qty 1

## 2016-05-17 MED ORDER — DIAZEPAM 5 MG/ML IJ SOLN
5.0000 mg | Freq: Once | INTRAMUSCULAR | Status: AC
  Administered 2016-05-17: 5 mg via INTRAVENOUS
  Filled 2016-05-17: qty 2

## 2016-05-17 MED ORDER — SODIUM CHLORIDE 0.9 % IV BOLUS (SEPSIS)
1000.0000 mL | Freq: Once | INTRAVENOUS | Status: AC
  Administered 2016-05-17: 1000 mL via INTRAVENOUS

## 2016-05-17 MED ORDER — PROCHLORPERAZINE EDISYLATE 5 MG/ML IJ SOLN
10.0000 mg | Freq: Once | INTRAMUSCULAR | Status: AC
  Administered 2016-05-17: 10 mg via INTRAVENOUS
  Filled 2016-05-17: qty 2

## 2016-05-17 MED ORDER — DIPHENHYDRAMINE HCL 50 MG/ML IJ SOLN
12.5000 mg | Freq: Once | INTRAMUSCULAR | Status: AC
  Administered 2016-05-17: 12.5 mg via INTRAVENOUS
  Filled 2016-05-17: qty 1

## 2016-05-17 NOTE — ED Notes (Signed)
Pt refused vitals. Pt stated she is angry for wait time for CT. This tech explained delay.
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
## 2016-05-17 NOTE — ED Notes (Signed)
Pt. Tearful stating she wants the IV out and she is in pain. EDP notified. Pt. Noted to have her C-collar on the side table.

## 2016-09-17 ENCOUNTER — Emergency Department (HOSPITAL_COMMUNITY)
Admission: EM | Admit: 2016-09-17 | Discharge: 2016-09-17 | Disposition: A | Discharge status: Home / Self Care | Payer: Medicaid Other | Attending: Emergency Medicine | Admitting: Emergency Medicine

## 2016-09-17 ENCOUNTER — Encounter (HOSPITAL_COMMUNITY): Payer: Self-pay | Admitting: Emergency Medicine

## 2016-09-17 DIAGNOSIS — S0101XA Laceration without foreign body of scalp, initial encounter: Secondary | ICD-10-CM

## 2016-09-17 DIAGNOSIS — Y999 Unspecified external cause status: Secondary | ICD-10-CM | POA: Diagnosis not present

## 2016-09-17 DIAGNOSIS — Y939 Activity, unspecified: Secondary | ICD-10-CM | POA: Diagnosis not present

## 2016-09-17 DIAGNOSIS — F17210 Nicotine dependence, cigarettes, uncomplicated: Secondary | ICD-10-CM | POA: Insufficient documentation

## 2016-09-17 DIAGNOSIS — Y929 Unspecified place or not applicable: Secondary | ICD-10-CM | POA: Diagnosis not present

## 2016-09-17 DIAGNOSIS — Z3A00 Weeks of gestation of pregnancy not specified: Secondary | ICD-10-CM | POA: Insufficient documentation

## 2016-09-17 DIAGNOSIS — Z79899 Other long term (current) drug therapy: Secondary | ICD-10-CM | POA: Diagnosis not present

## 2016-09-17 DIAGNOSIS — O9A219 Injury, poisoning and certain other consequences of external causes complicating pregnancy, unspecified trimester: Secondary | ICD-10-CM | POA: Insufficient documentation

## 2016-09-17 DIAGNOSIS — S0181XA Laceration without foreign body of other part of head, initial encounter: Secondary | ICD-10-CM | POA: Diagnosis not present

## 2016-09-17 MED ORDER — BACITRACIN ZINC 500 UNIT/GM EX OINT: 1.0000 "application " | TOPICAL_OINTMENT | Freq: Two times a day (BID) | CUTANEOUS | 0 refills | Status: DC

## 2016-09-17 MED ORDER — LIDOCAINE-EPINEPHRINE (PF) 2 %-1:200000 IJ SOLN
10.0000 mL | Freq: Once | INTRAMUSCULAR | Status: AC
  Administered 2016-09-17: 10 mL
  Filled 2016-09-17: qty 20
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
## 2016-09-17 NOTE — Discharge Instructions (Signed)
Sutures will dissolve on it's own in 2-3 weeks..  Please read the instructions provided on wound care. Keep the area clean and dry, apply bacitracin ointment daily and take the medications provided. RETURN TO THE ER IF THERE IS INCREASED PAIN, REDNESS, PUS COMING OUT from the wound site.
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
## 2016-09-17 NOTE — ED Notes (Signed)
ED Provider at bedside. 

## 2017-01-13 ENCOUNTER — Emergency Department (HOSPITAL_COMMUNITY): Payer: Medicaid Other

## 2017-01-13 ENCOUNTER — Encounter (HOSPITAL_COMMUNITY): Payer: Self-pay | Admitting: Emergency Medicine

## 2017-01-13 ENCOUNTER — Emergency Department (HOSPITAL_COMMUNITY)
Admission: EM | Admit: 2017-01-13 | Discharge: 2017-01-13 | Disposition: A | Discharge status: Home / Self Care | Payer: Medicaid Other | Attending: Emergency Medicine | Admitting: Emergency Medicine

## 2017-01-13 DIAGNOSIS — F17210 Nicotine dependence, cigarettes, uncomplicated: Secondary | ICD-10-CM | POA: Diagnosis not present

## 2017-01-13 DIAGNOSIS — R102 Pelvic and perineal pain unspecified side: Secondary | ICD-10-CM

## 2017-01-13 DIAGNOSIS — R1012 Left upper quadrant pain: Secondary | ICD-10-CM | POA: Diagnosis present

## 2017-01-13 DIAGNOSIS — Z79899 Other long term (current) drug therapy: Secondary | ICD-10-CM | POA: Diagnosis not present

## 2017-01-13 DIAGNOSIS — R1031 Right lower quadrant pain: Secondary | ICD-10-CM

## 2017-01-13 DIAGNOSIS — R109 Unspecified abdominal pain: Secondary | ICD-10-CM

## 2017-01-13 DIAGNOSIS — B354 Tinea corporis: Secondary | ICD-10-CM

## 2017-01-13 LAB — COMPREHENSIVE METABOLIC PANEL
ALBUMIN: 3.6 g/dL (ref 3.5–5.0)
ALT: 30 U/L (ref 14–54)
ANION GAP: 7 (ref 5–15)
AST: 30 U/L (ref 15–41)
Alkaline Phosphatase: 69 U/L (ref 38–126)
BUN: 5 mg/dL — ABNORMAL LOW (ref 6–20)
CHLORIDE: 105 mmol/L (ref 101–111)
CO2: 25 mmol/L (ref 22–32)
Calcium: 8.8 mg/dL — ABNORMAL LOW (ref 8.9–10.3)
Creatinine, Ser: 0.66 mg/dL (ref 0.44–1.00)
GFR calc Af Amer: >60 mL/min (ref >60)
GFR calc non Af Amer: >60 mL/min (ref >60)
GLUCOSE: 108 mg/dL — AB (ref 65–99)
POTASSIUM: 3.5 mmol/L (ref 3.5–5.1)
SODIUM: 137 mmol/L (ref 135–145)
Total Bilirubin: 0.4 mg/dL (ref 0.3–1.2)
Total Protein: 6.9 g/dL (ref 6.5–8.1)

## 2017-01-13 LAB — I-STAT BETA HCG BLOOD, ED (MC, WL, AP ONLY): I-stat hCG, quantitative: <5 m[IU]/mL (ref <5)

## 2017-01-13 LAB — CBC
HEMATOCRIT: 38.2 % (ref 36.0–46.0)
HEMOGLOBIN: 12.6 g/dL (ref 12.0–15.0)
MCH: 27.9 pg (ref 26.0–34.0)
MCHC: 33 g/dL (ref 30.0–36.0)
MCV: 84.7 fL (ref 78.0–100.0)
Platelets: 244 10*3/uL (ref 150–400)
RBC: 4.51 MIL/uL (ref 3.87–5.11)
RDW: 14.3 % (ref 11.5–15.5)
WBC: 7.3 10*3/uL (ref 4.0–10.5)

## 2017-01-13 LAB — LIPASE, BLOOD: Lipase: 17 U/L (ref 11–51)

## 2017-01-13 MED ORDER — TERBINAFINE HCL 1 % EX CREA: 1.0000 "application " | TOPICAL_CREAM | Freq: Two times a day (BID) | CUTANEOUS | 0 refills | Status: AC

## 2017-01-13 NOTE — ED Provider Notes (Signed)
[facility]Y DEPT Provider Note   CSN: [id number] Arrival date & time: 01/13/17  1105     History   Chief Complaint Chief Complaint  Patient presents with  . Abdominal Pain  . Possible Pregnancy    HPI Felicia Yoder is a 36 y.o. female.  HPI  36 year old female presents today with numerous complaints.  Patient notes over the past several months she has not had her regular menstrual cycles.  She reports proximal 4 months ago she had normal bleeding for approximately 3-4 days.  She notes the subsequent months she has very light spotting for 1-2 days.  She has no spotting in between her normal menstrual cycles, no bleeding now.  She denies any vaginal discharge.  Patient also notes she is had intermittent left upper quadrant and right lower quadrant discomfort.  She describes this as "movement" in her belly as if she is pregnant.  She notes this comes and goes and is nonsevere.  She denies any fever, nausea vomiting or diarrhea.  She reports normal bowel movements.   Patient also notes a rash to her right trapezius circular in nature that has been there over the last week.  She describes as itchy no other rashes.  Past Medical History:  Diagnosis Date  . Obesity     There are no active problems to display for this patient.   History reviewed. No pertinent surgical history.  OB History    Gravida Para Term Preterm AB Living   1             SAB TAB Ectopic Multiple Live Births                   Home Medications    Prior to Admission medications   Medication Sig Start Date End Date Taking? Authorizing Provider  chlorhexidine (PERIDEX) 0.12 % solution Use as directed 15 mLs in the mouth or throat 2 (two) times daily. Patient not taking: Reported on 09/17/2016 03/09/16   Fayrene Helper, PA-C  diphenhydrAMINE (BENADRYL) 25 MG tablet Take 1 tablet (25 mg total) by mouth every 6 (six) hours. Patient not taking: Reported on 09/17/2016 02/21/16   Melton Krebs, PA-C    EPINEPHrine (EPIPEN 2-PAK) 0.3 mg/0.3 mL IJ SOAJ injection Inject 0.3 mLs (0.3 mg total) into the muscle once. Patient not taking: Reported on 01/13/2017 02/21/16   Melton Krebs, PA-C  famotidine (PEPCID) 20 MG tablet Take 1 tablet (20 mg total) by mouth 2 (two) times daily. Patient not taking: Reported on 09/17/2016 02/21/16   Melton Krebs, PA-C  metoCLOPramide (REGLAN) 10 MG tablet Take 1 tablet (10 mg total) by mouth every 6 (six) hours as needed for nausea (nausea/headache). Patient not taking: Reported on 09/17/2016 05/04/16   Mercedes Street, PA-C  terbinafine (LAMISIL AT) 1 % cream Apply 1 application topically 2 (two) times daily. 01/13/17   Eyvonne Mechanic, PA-C    Family History No family history on file.  Social History Social History  Substance Use Topics  . Smoking status: Current Every Day Smoker    Packs/day: 1.00    Types: Cigarettes  . Smokeless tobacco: Never Used  . Alcohol use No     Allergies   Azithromycin   Review of Systems Review of Systems  All other systems reviewed and are negative.    Physical Exam Updated Vital Signs BP 133/80 (BP Location: Right Arm)   Pulse 88   Temp 98.2 F (36.8 C)   Resp 18  LMP 01/11/2017   SpO2 99%   Breastfeeding? Unknown   Physical Exam  Constitutional: She is oriented to person, place, and time. She appears well-developed and well-nourished.  Morbidly obese  HENT:  Head: Normocephalic and atraumatic.  Eyes: Conjunctivae are normal. Pupils are equal, round, and reactive to light. Right eye exhibits no discharge. Left eye exhibits no discharge. No scleral icterus.  Neck: Normal range of motion. No JVD present. No tracheal deviation present.  Pulmonary/Chest: Effort normal. No stridor.  Abdominal:  Minor tenderness to palpation of left upper quadrant, minor tenderness palpation of right lower quadrant.  Remainder of abdominal exam is benign.  Neurological: She is alert and oriented to person,  place, and time. Coordination normal.  Skin:  Dry annular rash to the right trapezius  Psychiatric: She has a normal mood and affect. Her behavior is normal. Judgment and thought content normal.  Nursing note and vitals reviewed.    ED Treatments / Results  Labs (all labs ordered are listed, but only abnormal results are displayed) Labs Reviewed  COMPREHENSIVE METABOLIC PANEL - Abnormal; Notable for the following:       Result Value   Glucose, Bld 108 (*)    BUN 5 (*)    Calcium 8.8 (*)    All other components within normal limits  LIPASE, BLOOD  CBC  URINALYSIS, ROUTINE W REFLEX MICROSCOPIC  I-STAT BETA HCG BLOOD, ED ([facility], [facility], [facility] ONLY)    EKG  EKG Interpretation None       Radiology US Abdomen Complete  Result Date: 01/13/2017 CLINICAL DATA:  Right lower quadrant and left upper corrected pain for 3 weeks EXAM: ABDOMEN ULTRASOUND COMPLETE COMPARISON:  None. FINDINGS: Gallbladder: No gallstones or wall thickening visualized. No sonographic Murphy sign noted by sonographer. Common bile duct: Diameter: 3.4 mm Liver: Heterogenous increased and coarse echotexture. No focal hepatic abnormality. IVC: No abnormality visualized. Pancreas: Visualized portion unremarkable. Spleen: Size and appearance within normal limits. Right Kidney: Length: 13 cm. Echogenicity within normal limits. No mass or hydronephrosis visualized. Left Kidney: Length: 13.1 cm. Echogenicity within normal limits. No mass or hydronephrosis visualized. Abdominal aorta: Limited visualization due to habitus and bowel gas Other findings: None. IMPRESSION: 1. Coarse heterogeneous echogenic liver suspicious for fatty infiltration. 2. Otherwise negative examination. Electronically Signed   By: Jasmine Pang M.D.   On: 01/13/2017 15:28   US Transvaginal Non-ob  Result Date: 01/13/2017 CLINICAL DATA:  Right lower quadrant pain for 3 weeks EXAM: TRANSABDOMINAL AND TRANSVAGINAL ULTRASOUND OF PELVIS TECHNIQUE: Both transabdominal  and transvaginal ultrasound examinations of the pelvis were performed. Transabdominal technique was performed for global imaging of the pelvis including uterus, ovaries, adnexal regions, and pelvic cul-de-sac. It was necessary to proceed with endovaginal exam following the transabdominal exam to visualize the endometrium and ovaries. COMPARISON:  None FINDINGS: Uterus Measurements: 11.6 x 5.2 x 6 cm. Difficult visualization of the uterine fundus transvaginally, due to patient habitus. No obvious myometrial masses. Mildly complicated cyst in the cervix, measuring 1.6 x 1.5 x 1.6 cm, likely nabothian cyst. Endometrium Thickness: 4.3 mm.  No focal abnormality visualized. Right ovary Measurements: 2.4 x 2 x 1.8 cm. Normal appearance/no adnexal mass. Left ovary Measurements: 2.2 x 1.9 x 1.8 cm. Normal appearance/no adnexal mass. Other findings No abnormal free fluid. IMPRESSION: 1. The examination was compromised by patient habitus. 2. Negative pelvic ultrasound Electronically Signed   By: Jasmine Pang M.D.   On: 01/13/2017 15:24   US Pelvis Complete  Result Date: 01/13/2017 CLINICAL  DATA:  Right lower quadrant pain for 3 weeks EXAM: TRANSABDOMINAL AND TRANSVAGINAL ULTRASOUND OF PELVIS TECHNIQUE: Both transabdominal and transvaginal ultrasound examinations of the pelvis were performed. Transabdominal technique was performed for global imaging of the pelvis including uterus, ovaries, adnexal regions, and pelvic cul-de-sac. It was necessary to proceed with endovaginal exam following the transabdominal exam to visualize the endometrium and ovaries. COMPARISON:  None FINDINGS: Uterus Measurements: 11.6 x 5.2 x 6 cm. Difficult visualization of the uterine fundus transvaginally, due to patient habitus. No obvious myometrial masses. Mildly complicated cyst in the cervix, measuring 1.6 x 1.5 x 1.6 cm, likely nabothian cyst. Endometrium Thickness: 4.3 mm.  No focal abnormality visualized. Right ovary Measurements: 2.4 x 2 x  1.8 cm. Normal appearance/no adnexal mass. Left ovary Measurements: 2.2 x 1.9 x 1.8 cm. Normal appearance/no adnexal mass. Other findings No abnormal free fluid. IMPRESSION: 1. The examination was compromised by patient habitus. 2. Negative pelvic ultrasound Electronically Signed   By: Jasmine Pang M.D.   On: 01/13/2017 15:24    Procedures Procedures (including critical care time)  Medications Ordered in ED Medications - No data to display   Initial Impression / Assessment and Plan / ED Course  I have reviewed the triage vital signs and the nursing notes.  Pertinent labs & imaging results that were available during my care of the patient were reviewed by me and considered in my medical decision making (see chart for details).     Final Clinical Impressions(s) / ED Diagnoses   Final diagnoses:  Abdominal pain  LUQ pain  RLQ abdominal pain  Pelvic pain  Abdominal pain, unspecified abdominal location  Tinea corporis    Labs: I-STAT beta-hCG, lipase, CMP  Imaging: Ultrasound abdomen complete, ultrasound pelvis complete  Consults:  Therapeutics:  Discharge Meds: lamisil   Assessment/Plan: Patient presents with numerous complaints.  Her abdominal pain is intermittent and ongoing over the last several months.  She has an ultrasound here with no significant findings, and reassuring laboratory analysis, with reassuring vital signs.  I have low suspicion for acute intra-abdominal pathology in this patient.  Her vaginal bleeding is her menstrual cycles, lighter than normal, no bleeding throughout the month other than that her menstrual cycle.  She denies any vaginal discharge.  She will be followed up with OB/GYN.  Patient also has what appears to be ringworm on her shoulder.  She will be given Lamisil for this.  Patient's instructed follow-up with both OB/GYN and her primary care for reassessment and ongoing management.  She is given strict return precautions.  She verbalized  understanding and agreement to today's plan had no further questions or concerns.       New Prescriptions New Prescriptions   TERBINAFINE (LAMISIL AT) 1 % CREAM    Apply 1 application topically 2 (two) times daily.     Eyvonne Mechanic, PA-C 01/13/17 1553    Derwood Kaplan, MD 01/14/17 [phone]

## 2017-01-13 NOTE — ED Notes (Signed)
ED Provider at bedside. 

## 2017-01-13 NOTE — Discharge Instructions (Signed)
Please read attached information. If you experience any new or worsening signs or symptoms please return to the emergency room for evaluation. Please follow-up with your primary care provider or specialist as discussed. Please use medication prescribed only as directed and discontinue taking if you have any concerning signs or symptoms.   °

## 2017-01-13 NOTE — ED Notes (Signed)
Patient transported to US 

## 2017-01-13 NOTE — ED Triage Notes (Signed)
Pt reports for the past few months she has had vaginal bleeding off and on for 1-2 days, states she took a pregnancy test a few months ago that was negative but reports LLQ pain and "movement" in her belly.

## 2017-03-10 ENCOUNTER — Emergency Department (HOSPITAL_COMMUNITY)
Admission: EM | Admit: 2017-03-10 | Discharge: 2017-03-10 | Disposition: A | Discharge status: Home / Self Care | Payer: Medicaid Other | Attending: Emergency Medicine | Admitting: Emergency Medicine

## 2017-03-10 ENCOUNTER — Encounter (HOSPITAL_COMMUNITY): Payer: Self-pay | Admitting: Emergency Medicine

## 2017-03-10 DIAGNOSIS — F17210 Nicotine dependence, cigarettes, uncomplicated: Secondary | ICD-10-CM | POA: Diagnosis not present

## 2017-03-10 DIAGNOSIS — K0889 Other specified disorders of teeth and supporting structures: Secondary | ICD-10-CM | POA: Insufficient documentation

## 2017-03-10 MED ORDER — IBUPROFEN 800 MG PO TABS: 800.0000 mg | ORAL_TABLET | Freq: Three times a day (TID) | ORAL | 0 refills | Status: AC

## 2017-03-10 MED ORDER — HYDROCODONE-ACETAMINOPHEN 5-325 MG PO TABS
1.0000 | ORAL_TABLET | Freq: Once | ORAL | Status: AC
  Administered 2017-03-10: 1 via ORAL
  Filled 2017-03-10: qty 1

## 2017-03-10 MED ORDER — IBUPROFEN 400 MG PO TABS
ORAL_TABLET | ORAL | Status: DC
Start: 2017-03-10 — End: 2017-03-10
  Filled 2017-03-10: qty 1

## 2017-03-10 MED ORDER — IBUPROFEN 400 MG PO TABS: 400.0000 mg | ORAL_TABLET | Freq: Once | ORAL | Status: DC

## 2017-03-10 NOTE — Discharge Instructions (Signed)
As discussed, apply heat to the area. Ibuprofen for pain. Follow up with your dentist tomorrow.  Return if you experience difficulty breathing or swallowing in the meantime.

## 2017-03-10 NOTE — ED Triage Notes (Signed)
t presents to ED for assessment of right upper dental pain, ongoing x 6 months, but worsening tonight.  Patient has a dental appointment in [place] tomorrow, but states she cannot sleep tonight due to pain.  Patient has several teeth with missing chunks in that area.

## 2017-03-10 NOTE — ED Notes (Signed)
Pt c/o upper right dental pain. See providers assessment.

## 2017-03-10 NOTE — ED Notes (Signed)
Pt stable, understands discharge instructions, and reasons for return.   

## 2017-03-10 NOTE — ED Provider Notes (Signed)
[facility] Provider Note   CSN: [id number] Arrival date & time: 03/10/17  0005     History   Chief Complaint Chief Complaint  Patient presents with  . Dental Pain    HPI Felicia Yoder is a 36 y.o. female presenting with right upper molar pain onset a few hours prior to arrival. She explains that she's been having mental problems for months but has avoided the dentist because she doesn't like needles. She has tried Orajel without relief. She has an appointment tomorrow with the dentist but couldn't wait because of the pain being to severe. She denies fever, chills, difficulty swallowing, swelling, redness, or any other symptoms but pain.  HPI  Past Medical History:  Diagnosis Date  . Obesity     There are no active problems to display for this patient.   History reviewed. No pertinent surgical history.  OB History    Gravida Para Term Preterm AB Living   1             SAB TAB Ectopic Multiple Live Births                   Home Medications    Prior to Admission medications   Medication Sig Start Date End Date Taking? Authorizing Provider  chlorhexidine (PERIDEX) 0.12 % solution Use as directed 15 mLs in the mouth or throat 2 (two) times daily. Patient not taking: Reported on 09/17/2016 03/09/16   Fayrene Helper, PA-C  diphenhydrAMINE (BENADRYL) 25 MG tablet Take 1 tablet (25 mg total) by mouth every 6 (six) hours. Patient not taking: Reported on 09/17/2016 02/21/16   Melton Krebs, PA-C  EPINEPHrine (EPIPEN 2-PAK) 0.3 mg/0.3 mL IJ SOAJ injection Inject 0.3 mLs (0.3 mg total) into the muscle once. Patient not taking: Reported on 01/13/2017 02/21/16   Melton Krebs, PA-C  famotidine (PEPCID) 20 MG tablet Take 1 tablet (20 mg total) by mouth 2 (two) times daily. Patient not taking: Reported on 09/17/2016 02/21/16   Melton Krebs, PA-C  ibuprofen (ADVIL,MOTRIN) 800 MG tablet Take 1 tablet (800 mg total) by mouth 3 (three) times daily. 03/10/17    Georgiana Shore, PA-C  metoCLOPramide (REGLAN) 10 MG tablet Take 1 tablet (10 mg total) by mouth every 6 (six) hours as needed for nausea (nausea/headache). Patient not taking: Reported on 09/17/2016 05/04/16   Street, [place], PA-C  terbinafine (LAMISIL AT) 1 % cream Apply 1 application topically 2 (two) times daily. 01/13/17   Eyvonne Mechanic, PA-C    Family History History reviewed. No pertinent family history.  Social History Social History  Substance Use Topics  . Smoking status: Current Every Day Smoker    Packs/day: 1.00    Types: Cigarettes  . Smokeless tobacco: Never Used  . Alcohol use No     Allergies   Azithromycin   Review of Systems Review of Systems  Constitutional: Negative for chills and fever.  HENT: Positive for dental problem. Negative for congestion, drooling, ear pain, facial swelling, sinus pain, sinus pressure, sore throat, trouble swallowing and voice change.   Eyes: Negative for pain and visual disturbance.  Respiratory: Negative for cough, choking, shortness of breath, wheezing and stridor.   Cardiovascular: Negative for chest pain and palpitations.  Gastrointestinal: Negative for abdominal pain, nausea and vomiting.  Musculoskeletal: Negative for arthralgias, back pain, neck pain and neck stiffness.  Skin: Negative for color change, pallor and rash.  Neurological: Negative for dizziness, seizures, syncope, facial asymmetry, light-headedness and headaches.  Physical Exam Updated Vital Signs BP 134/87 (BP Location: Right Arm)   Pulse 76   Temp 97.9 F (36.6 C) (Oral)   Resp 16   SpO2 100%   Physical Exam  Constitutional: She appears well-developed and well-nourished. No distress.  Patient is afebrile, nontoxic-appearing, sitting comfortably in chair in no acute distress.  HENT:  Head: Normocephalic and atraumatic.  Mouth/Throat: Oropharynx is clear and moist. No oropharyngeal exudate.  No gross oral abscess. Uvula is midline, arches  are simmetrical and intact. No peritonsilar swelling or exudate. No trismus. Sublingual mucosa is soft and non-tender. Tolerating oral secretions. No concern for ludwig's angina.  Eyes: Conjunctivae and EOM are normal.  Neck: Normal range of motion. Neck supple.  Cardiovascular: Normal rate, regular rhythm and normal heart sounds.   No murmur heard. Pulmonary/Chest: Effort normal and breath sounds normal. No respiratory distress. She has no wheezes. She has no rales.  Musculoskeletal: Normal range of motion. She exhibits no edema.  Lymphadenopathy:    She has no cervical adenopathy.  Neurological: She is alert.  Skin: Skin is warm and dry. No rash noted. She is not diaphoretic. No erythema. No pallor.  Psychiatric: She has a normal mood and affect.  Nursing note and vitals reviewed.    ED Treatments / Results  Labs (all labs ordered are listed, but only abnormal results are displayed) Labs Reviewed - No data to display  EKG  EKG Interpretation None       Radiology No results found.  Procedures Procedures (including critical care time)  Medications Ordered in ED Medications  ibuprofen (ADVIL,MOTRIN) tablet 400 mg (not administered)  HYDROcodone-acetaminophen (NORCO/VICODIN) 5-325 MG per tablet 1 tablet (not administered)     Initial Impression / Assessment and Plan / ED Course  I have reviewed the triage vital signs and the nursing notes.  Pertinent labs & imaging results that were available during my care of the patient were reviewed by me and considered in my medical decision making (see chart for details).     Patient with toothache.  No gross abscess.  Exam unconcerning for Ludwig's angina or spread of infection.  Patient has appointment with the dentist tomorrow. Patient is well-appearing, nontoxic and afebrile. Stable vital signs.  Will discharge home with symptomatic relief including ibuprofen and follow-up with her dentist.    Discussed strict return  precautions and advised to return to the emergency department if experiencing any new or worsening symptoms. Instructions were understood and patient agreed with discharge plan.  Final Clinical Impressions(s) / ED Diagnoses   Final diagnoses:  Pain, dental    New Prescriptions New Prescriptions   IBUPROFEN (ADVIL,MOTRIN) 800 MG TABLET    Take 1 tablet (800 mg total) by mouth 3 (three) times daily.     Georgiana ShoreMitchell, Svetlana Bagby B, PA-C 03/10/17 0144    Derwood KaplanNanavati, Ankit, MD 03/10/17 [phone]
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
## 2017-10-05 ENCOUNTER — Encounter (HOSPITAL_COMMUNITY): Payer: Self-pay | Admitting: Emergency Medicine

## 2017-10-05 ENCOUNTER — Emergency Department (HOSPITAL_COMMUNITY)
Admission: EM | Admit: 2017-10-05 | Discharge: 2017-10-05 | Disposition: A | Discharge status: Home / Self Care | Payer: Medicaid Other | Attending: Emergency Medicine | Admitting: Emergency Medicine

## 2017-10-05 DIAGNOSIS — K047 Periapical abscess without sinus: Secondary | ICD-10-CM

## 2017-10-05 DIAGNOSIS — K029 Dental caries, unspecified: Secondary | ICD-10-CM | POA: Diagnosis not present

## 2017-10-05 DIAGNOSIS — F17210 Nicotine dependence, cigarettes, uncomplicated: Secondary | ICD-10-CM | POA: Diagnosis not present

## 2017-10-05 DIAGNOSIS — K0889 Other specified disorders of teeth and supporting structures: Secondary | ICD-10-CM | POA: Diagnosis present

## 2017-10-05 DIAGNOSIS — Z79899 Other long term (current) drug therapy: Secondary | ICD-10-CM | POA: Insufficient documentation

## 2017-10-05 HISTORY — DX: Obesity, unspecified: E66.9

## 2017-10-05 MED ORDER — AMOXICILLIN 500 MG PO CAPS: 500.0000 mg | ORAL_CAPSULE | Freq: Three times a day (TID) | ORAL | 0 refills | Status: AC

## 2017-10-05 MED ORDER — OXYCODONE-ACETAMINOPHEN 5-325 MG PO TABS
1.0000 | ORAL_TABLET | Freq: Once | ORAL | Status: AC
  Administered 2017-10-05: 1 via ORAL
  Filled 2017-10-05: qty 1

## 2017-10-05 MED ORDER — NAPROXEN 500 MG PO TABS: 500.0000 mg | ORAL_TABLET | Freq: Two times a day (BID) | ORAL | 0 refills | Status: AC

## 2017-10-05 MED ORDER — AMOXICILLIN 500 MG PO CAPS
500.0000 mg | ORAL_CAPSULE | Freq: Once | ORAL | Status: AC
  Administered 2017-10-05: 500 mg via ORAL
  Filled 2017-10-05: qty 1

## 2017-10-05 MED ORDER — CHLORHEXIDINE GLUCONATE 0.12 % MT SOLN: 15.0000 mL | Freq: Two times a day (BID) | OROMUCOSAL | 0 refills | Status: AC

## 2017-10-05 NOTE — ED Provider Notes (Signed)
[facility]   CSN: [id number] Arrival date & time: 10/05/17  2136     History   Chief Complaint Chief Complaint  Patient presents with  . Dental Pain    HPI Felicia Yoder is a 37 y.o. female who presents to the ED with dental pain. The pain started today in the right upper dental area. Patient has had the same problem in the past and referred to a dentist but did not go.   HPI  Past Medical History:  Diagnosis Date  . Obese   . Obesity     There are no active problems to display for this patient.   History reviewed. No pertinent surgical history.  OB History    Gravida Para Term Preterm AB Living   1             SAB TAB Ectopic Multiple Live Births                   Home Medications    Prior to Admission medications   Medication Sig Start Date End Date Taking? Authorizing Provider  amoxicillin (AMOXIL) 500 MG capsule Take 1 capsule (500 mg total) by mouth 3 (three) times daily. 10/05/17   Janne NapoleonNeese, Perle Brickhouse M, NP  chlorhexidine (PERIDEX) 0.12 % solution Use as directed 15 mLs in the mouth or throat 2 (two) times daily. 10/05/17   Janne NapoleonNeese, Kahleah Crass M, NP  EPINEPHrine (EPIPEN 2-PAK) 0.3 mg/0.3 mL IJ SOAJ injection Inject 0.3 mLs (0.3 mg total) into the muscle once. Patient not taking: Reported on 01/13/2017 02/21/16   Melton Krebsiley, Samantha Nicole, PA-C  famotidine (PEPCID) 20 MG tablet Take 1 tablet (20 mg total) by mouth 2 (two) times daily. Patient not taking: Reported on 09/17/2016 02/21/16   Melton Krebsiley, Samantha Nicole, PA-C  ibuprofen (ADVIL,MOTRIN) 800 MG tablet Take 1 tablet (800 mg total) by mouth 3 (three) times daily. 03/10/17   Mathews RobinsonsMitchell, Jessica B, PA-C  naproxen (NAPROSYN) 500 MG tablet Take 1 tablet (500 mg total) by mouth 2 (two) times daily. 10/05/17   Janne NapoleonNeese, Mayur Duman M, NP  terbinafine (LAMISIL AT) 1 % cream Apply 1 application topically 2 (two) times daily. 01/13/17   Eyvonne MechanicHedges, Jeffrey, PA-C    Family History History reviewed. No  pertinent family history.  Social History Social History   Tobacco Use  . Smoking status: Current Every Day Smoker    Packs/day: 1.00    Types: Cigarettes  . Smokeless tobacco: Never Used  Substance Use Topics  . Alcohol use: No  . Drug use: Yes    Types: Marijuana     Allergies   Azithromycin   Review of Systems Review of Systems  HENT: Positive for dental problem.   All other systems reviewed and are negative.    Physical Exam Updated Vital Signs BP 131/78 (BP Location: Left Arm)   Pulse 87   Temp 98.7 F (37.1 C) (Oral)   Resp 16   LMP 09/28/2017 (Approximate)   SpO2 99%   Physical Exam  Constitutional: She appears well-developed and well-nourished. No distress.  HENT:  Head: Normocephalic.  Mouth/Throat: Dental caries present. No uvula swelling. No oropharyngeal exudate, posterior oropharyngeal edema or posterior oropharyngeal erythema.    Eyes: EOM are normal.  Neck: Normal range of motion. Neck supple.  Cardiovascular: Normal rate.  Pulmonary/Chest: Effort normal.  Musculoskeletal: Normal range of motion.  Lymphadenopathy:    She has no cervical adenopathy.  Neurological: She is alert.  Skin: Skin is  warm and dry.  Psychiatric: Her mood appears anxious.  Nursing note and vitals reviewed.    ED Treatments / Results  Labs (all labs ordered are listed, but only abnormal results are displayed) Labs Reviewed - No data to display  Radiology No results found.  Procedures Procedures (including critical care time)  Medications Ordered in ED Medications  oxyCODONE-acetaminophen (PERCOCET/ROXICET) 5-325 MG per tablet 1 tablet (1 tablet Oral Given 10/05/17 2208)  amoxicillin (AMOXIL) capsule 500 mg (500 mg Oral Given 10/05/17 2208)     Initial Impression / Assessment and Plan / ED Course  I have reviewed the triage vital signs and the nursing notes. Patient with toothache.  No gross abscess.  Exam unconcerning for Ludwig's angina or spread of  infection.  Will treat with Amoxicillin and anti-inflammatories medicine.  Urged patient to follow-up with dentist.    Final Clinical Impressions(s) / ED Diagnoses   Final diagnoses:  Infected dental caries    ED Discharge Orders        Ordered    amoxicillin (AMOXIL) 500 MG capsule  3 times daily     10/05/17 2150    naproxen (NAPROSYN) 500 MG tablet  2 times daily     10/05/17 2150    chlorhexidine (PERIDEX) 0.12 % solution  2 times daily     10/05/17 2151       Kerrie Buffalo Zihlman, [place] 10/05/17 2211    Bethann Berkshire, MD 10/05/17 2343

## 2017-10-05 NOTE — ED Triage Notes (Signed)
Patient reports right upper molar pain onset today unrelieved by OTC pain medication .

## 2017-10-05 NOTE — Discharge Instructions (Signed)
Follow-up with a dentist as soon as possible.

## 2017-10-25 ENCOUNTER — Emergency Department (HOSPITAL_COMMUNITY): Payer: Medicaid Other

## 2017-10-25 ENCOUNTER — Emergency Department (HOSPITAL_COMMUNITY)
Admission: EM | Admit: 2017-10-25 | Discharge: 2017-10-25 | Disposition: A | Discharge status: Home / Self Care | Payer: Medicaid Other | Attending: Emergency Medicine | Admitting: Emergency Medicine

## 2017-10-25 ENCOUNTER — Encounter (HOSPITAL_COMMUNITY): Payer: Self-pay

## 2017-10-25 DIAGNOSIS — R69 Illness, unspecified: Secondary | ICD-10-CM

## 2017-10-25 DIAGNOSIS — Z79899 Other long term (current) drug therapy: Secondary | ICD-10-CM | POA: Diagnosis not present

## 2017-10-25 DIAGNOSIS — F17210 Nicotine dependence, cigarettes, uncomplicated: Secondary | ICD-10-CM | POA: Diagnosis not present

## 2017-10-25 DIAGNOSIS — R509 Fever, unspecified: Secondary | ICD-10-CM | POA: Diagnosis present

## 2017-10-25 DIAGNOSIS — J111 Influenza due to unidentified influenza virus with other respiratory manifestations: Secondary | ICD-10-CM | POA: Diagnosis not present

## 2017-10-25 LAB — COMPREHENSIVE METABOLIC PANEL
ALK PHOS: 80 U/L (ref 38–126)
ALT: 40 U/L (ref 14–54)
ANION GAP: 12 (ref 5–15)
AST: 36 U/L (ref 15–41)
Albumin: 3.7 g/dL (ref 3.5–5.0)
BILIRUBIN TOTAL: 0.7 mg/dL (ref 0.3–1.2)
BUN: 5 mg/dL — ABNORMAL LOW (ref 6–20)
CALCIUM: 9 mg/dL (ref 8.9–10.3)
CO2: 22 mmol/L (ref 22–32)
CREATININE: 0.78 mg/dL (ref 0.44–1.00)
Chloride: 100 mmol/L — ABNORMAL LOW (ref 101–111)
GFR calc non Af Amer: >60 mL/min (ref >60)
Glucose, Bld: 107 mg/dL — ABNORMAL HIGH (ref 65–99)
Potassium: 4 mmol/L (ref 3.5–5.1)
Sodium: 134 mmol/L — ABNORMAL LOW (ref 135–145)
Total Protein: 7.7 g/dL (ref 6.5–8.1)

## 2017-10-25 LAB — URINALYSIS, ROUTINE W REFLEX MICROSCOPIC
Bilirubin Urine: NEGATIVE
Glucose, UA: NEGATIVE mg/dL
KETONES UR: 5 mg/dL — AB
LEUKOCYTES UA: NEGATIVE
NITRITE: NEGATIVE
PH: 5 (ref 5.0–8.0)
Protein, ur: 30 mg/dL — AB
Specific Gravity, Urine: 1.026 (ref 1.005–1.030)

## 2017-10-25 LAB — CBC
HCT: 41.5 % (ref 36.0–46.0)
Hemoglobin: 13.8 g/dL (ref 12.0–15.0)
MCH: 28.4 pg (ref 26.0–34.0)
MCHC: 33.3 g/dL (ref 30.0–36.0)
MCV: 85.4 fL (ref 78.0–100.0)
PLATELETS: 222 10*3/uL (ref 150–400)
RBC: 4.86 MIL/uL (ref 3.87–5.11)
RDW: 14.1 % (ref 11.5–15.5)
WBC: 7.4 10*3/uL (ref 4.0–10.5)

## 2017-10-25 LAB — I-STAT BETA HCG BLOOD, ED (MC, WL, AP ONLY): I-stat hCG, quantitative: <5 m[IU]/mL (ref <5)

## 2017-10-25 LAB — LIPASE, BLOOD: Lipase: 21 U/L (ref 11–51)

## 2017-10-25 MED ORDER — ACETAMINOPHEN 325 MG PO TABS
650.0000 mg | ORAL_TABLET | Freq: Once | ORAL | Status: AC | PRN
  Administered 2017-10-25: 650 mg via ORAL
  Filled 2017-10-25: qty 2

## 2017-10-25 MED ORDER — SODIUM CHLORIDE 0.9 % IV BOLUS (SEPSIS)
1000.0000 mL | Freq: Once | INTRAVENOUS | Status: AC
  Administered 2017-10-25: 1000 mL via INTRAVENOUS

## 2017-10-25 MED ORDER — IBUPROFEN 400 MG PO TABS
400.0000 mg | ORAL_TABLET | Freq: Once | ORAL | Status: AC
  Administered 2017-10-25: 400 mg via ORAL
  Filled 2017-10-25: qty 1

## 2017-10-25 MED ORDER — OSELTAMIVIR PHOSPHATE 75 MG PO CAPS: 75.0000 mg | ORAL_CAPSULE | Freq: Two times a day (BID) | ORAL | 0 refills | Status: AC

## 2017-10-25 NOTE — ED Provider Notes (Signed)
[facility] EMERGENCY DEPARTMENT Provider Note   CSN: [id number] Arrival date & time: 10/25/17  4098     History   Chief Complaint Chief Complaint  Patient presents with  . chills/diarrhea    HPI Felicia Yoder is a 37 y.o. female.  Patient presents w c/o fever, cough, body aches, diarrhea for the past days. Symptoms persistent, moderate, felt worse today. Cough generally non productive. No sore throat. +congestion. No headache. No neck pain or stiffness. Denies known ill contacts. 2-3 loose to watery stools today. No vomiting. Denies abd pain. No dysuria or gu c/o. No rash.    The history is provided by the patient.    Past Medical History:  Diagnosis Date  . Obese   . Obesity     There are no active problems to display for this patient.   History reviewed. No pertinent surgical history.  OB History    Gravida Para Term Preterm AB Living   1             SAB TAB Ectopic Multiple Live Births                   Home Medications    Prior to Admission medications   Medication Sig Start Date End Date Taking? Authorizing Provider  amoxicillin (AMOXIL) 500 MG capsule Take 1 capsule (500 mg total) by mouth 3 (three) times daily. 10/05/17   Janne Napoleon, NP  chlorhexidine (PERIDEX) 0.12 % solution Use as directed 15 mLs in the mouth or throat 2 (two) times daily. 10/05/17   Janne Napoleon, NP  EPINEPHrine (EPIPEN 2-PAK) 0.3 mg/0.3 mL IJ SOAJ injection Inject 0.3 mLs (0.3 mg total) into the muscle once. Patient not taking: Reported on 01/13/2017 02/21/16   Melton Krebs, PA-C  famotidine (PEPCID) 20 MG tablet Take 1 tablet (20 mg total) by mouth 2 (two) times daily. Patient not taking: Reported on 09/17/2016 02/21/16   Melton Krebs, PA-C  ibuprofen (ADVIL,MOTRIN) 800 MG tablet Take 1 tablet (800 mg total) by mouth 3 (three) times daily. 03/10/17   Mathews Robinsons B, PA-C  naproxen (NAPROSYN) 500 MG tablet Take 1 tablet (500 mg total) by mouth 2  (two) times daily. 10/05/17   Janne Napoleon, NP  terbinafine (LAMISIL AT) 1 % cream Apply 1 application topically 2 (two) times daily. 01/13/17   Eyvonne Mechanic, PA-C    Family History No family history on file.  Social History Social History   Tobacco Use  . Smoking status: Current Every Day Smoker    Packs/day: 1.00    Types: Cigarettes  . Smokeless tobacco: Never Used  Substance Use Topics  . Alcohol use: No  . Drug use: Yes    Types: Marijuana     Allergies   Azithromycin   Review of Systems Review of Systems  Constitutional: Positive for fever.  HENT: Negative for sore throat.   Eyes: Negative for redness.  Respiratory: Positive for cough. Negative for shortness of breath.   Cardiovascular: Negative for chest pain.  Gastrointestinal: Positive for diarrhea. Negative for abdominal pain and vomiting.  Genitourinary: Negative for dysuria and flank pain.  Musculoskeletal: Positive for myalgias. Negative for back pain and neck pain.  Skin: Negative for rash.  Neurological: Negative for headaches.  Hematological: Does not bruise/bleed easily.  Psychiatric/Behavioral: Negative for confusion.     Physical Exam Updated Vital Signs BP (!) 143/83 (BP Location: Right Arm)   Pulse (!) 106   Temp  99.6 F (37.6 C) (Oral)   Resp 20   LMP 09/28/2017 (Approximate)   SpO2 100%   Physical Exam  Constitutional: She appears well-developed and well-nourished. No distress.  HENT:  Mouth/Throat: Oropharynx is clear and moist.  Eyes: Conjunctivae are normal. Pupils are equal, round, and reactive to light. No scleral icterus.  Neck: Normal range of motion. Neck supple. No tracheal deviation present.  No stiffness or rigidity  Cardiovascular: Regular rhythm, normal heart sounds and intact distal pulses. Exam reveals no gallop and no friction rub.  No murmur heard. Pulmonary/Chest: Effort normal and breath sounds normal. No respiratory distress.  Abdominal: Soft. Normal  appearance and bowel sounds are normal. She exhibits no distension. There is no tenderness.  Genitourinary:  Genitourinary Comments: No cva tenderness  Musculoskeletal: She exhibits no edema or tenderness.  Neurological: She is alert.  Skin: Skin is warm and dry. No rash noted. She is not diaphoretic.  Psychiatric: She has a normal mood and affect.  Nursing note and vitals reviewed.    ED Treatments / Results  Labs (all labs ordered are listed, but only abnormal results are displayed) Results for orders placed or performed during the hospital encounter of 10/25/17  Lipase, blood  Result Value Ref Range   Lipase 21 11 - 51 U/L  Comprehensive metabolic panel  Result Value Ref Range   Sodium 134 (L) 135 - 145 mmol/L   Potassium 4.0 3.5 - 5.1 mmol/L   Chloride 100 (L) 101 - 111 mmol/L   CO2 22 22 - 32 mmol/L   Glucose, Bld 107 (H) 65 - 99 mg/dL   BUN 5 (L) 6 - 20 mg/dL   Creatinine, Ser 4.090.78 0.44 - 1.00 mg/dL   Calcium 9.0 8.9 - 81.110.3 mg/dL   Total Protein 7.7 6.5 - 8.1 g/dL   Albumin 3.7 3.5 - 5.0 g/dL   AST 36 15 - 41 U/L   ALT 40 14 - 54 U/L   Alkaline Phosphatase 80 38 - 126 U/L   Total Bilirubin 0.7 0.3 - 1.2 mg/dL   GFR calc non Af Amer >60 >60 mL/min   GFR calc Af Amer >60 >60 mL/min   Anion gap 12 5 - 15  CBC  Result Value Ref Range   WBC 7.4 4.0 - 10.5 K/uL   RBC 4.86 3.87 - 5.11 MIL/uL   Hemoglobin 13.8 12.0 - 15.0 g/dL   HCT 91.441.5 78.236.0 - 95.646.0 %   MCV 85.4 78.0 - 100.0 fL   MCH 28.4 26.0 - 34.0 pg   MCHC 33.3 30.0 - 36.0 g/dL   RDW 21.314.1 08.611.5 - 57.815.5 %   Platelets 222 150 - 400 K/uL  Urinalysis, Routine w reflex microscopic  Result Value Ref Range   Color, Urine YELLOW YELLOW   APPearance HAZY (A) CLEAR   Specific Gravity, Urine 1.026 1.005 - 1.030   pH 5.0 5.0 - 8.0   Glucose, UA NEGATIVE NEGATIVE mg/dL   Hgb urine dipstick SMALL (A) NEGATIVE   Bilirubin Urine NEGATIVE NEGATIVE   Ketones, ur 5 (A) NEGATIVE mg/dL   Protein, ur 30 (A) NEGATIVE mg/dL    Nitrite NEGATIVE NEGATIVE   Leukocytes, UA NEGATIVE NEGATIVE   RBC / HPF 6-30 0 - 5 RBC/hpf   WBC, UA 0-5 0 - 5 WBC/hpf   Bacteria, UA RARE (A) NONE SEEN   Squamous Epithelial / LPF 0-5 (A) NONE SEEN   Mucus PRESENT   I-Stat beta hCG blood, ED  Result Value Ref Range  I-stat hCG, quantitative <5.0 <5 mIU/mL   Comment 3            EKG  EKG Interpretation None       Radiology Dg Chest 2 View  Result Date: 10/25/2017 CLINICAL DATA:  Two days of chills.  Low-grade fever. EXAM: CHEST  2 VIEW COMPARISON:  Feb 12, 2016 FINDINGS: The heart size and mediastinal contours are within normal limits. Both lungs are clear. The visualized skeletal structures are unremarkable. IMPRESSION: No active cardiopulmonary disease. Electronically Signed   By: Gerome Sam III M.D   On: 10/25/2017 18:35    Procedures Procedures (including critical care time)  Medications Ordered in ED Medications  sodium chloride 0.9 % bolus 1,000 mL (not administered)  acetaminophen (TYLENOL) tablet 650 mg (650 mg Oral Given 10/25/17 1237)     Initial Impression / Assessment and Plan / ED Course  I have reviewed the triage vital signs and the nursing notes.  Pertinent labs & imaging results that were available during my care of the patient were reviewed by me and considered in my medical decision making (see chart for details).  Iv ns bolus. Labs. Cxr.  Reviewed nursing notes and prior charts for additional history.   Po fluids.  Patient tolerates po fluids.   Labs reviewed, bmet unremarkable except sl low na, ua c/w mild dehydration, wbc normal.  cxr without infiltrate.   Symptoms most c/w viral illness.  Patient appears stable for d/c.     Final Clinical Impressions(s) / ED Diagnoses   Final diagnoses:  None    ED Discharge Orders    None       Cathren Laine, MD 10/25/17 2004

## 2017-10-25 NOTE — ED Notes (Signed)
ED Provider at bedside. 

## 2017-10-25 NOTE — ED Triage Notes (Signed)
patiernt complains of 2 days of chills, bodyaches nausea and diarrhea. Low grade fever with same

## 2017-10-25 NOTE — Discharge Instructions (Signed)
It was our pleasure to provide your ER care today - we hope that you feel better.  Rest. Drink plenty of fluids.  Take acetaminophen and ibuprofen as need for fever and body aches.   Take mucinex as need for cough/congestion.    Take tamiflu as prescribed.   Follow up with primary care doctor in 1 week if symptoms fail to improve/resolve.  Return to ER if worse, new symptoms, increased trouble breathing, persistent vomiting, other concern.

## 2017-10-25 NOTE — ED Notes (Signed)
Pt's vitals updated; IV still running (halfway done).

## 2017-10-25 NOTE — ED Notes (Signed)
Patient verbalizes understanding of discharge instructions. Opportunity for questioning and answers were provided. Armband removed by staff, pt discharged from ED via wheelchair.  

## 2017-10-25 NOTE — ED Notes (Signed)
Patient transported to X-ray 

## 2022-10-17 ENCOUNTER — Other Ambulatory Visit: Payer: Self-pay

## 2022-10-17 ENCOUNTER — Encounter (HOSPITAL_COMMUNITY): Payer: Self-pay | Admitting: *Deleted

## 2022-10-17 ENCOUNTER — Emergency Department (HOSPITAL_COMMUNITY)
Admission: EM | Admit: 2022-10-17 | Discharge: 2022-10-18 | Discharge status: Against Medical Advice | Payer: 59 | Attending: Emergency Medicine | Admitting: Emergency Medicine

## 2022-10-17 DIAGNOSIS — R42 Dizziness and giddiness: Secondary | ICD-10-CM | POA: Insufficient documentation

## 2022-10-17 DIAGNOSIS — Z20822 Contact with and (suspected) exposure to covid-19: Secondary | ICD-10-CM | POA: Insufficient documentation

## 2022-10-17 DIAGNOSIS — Z5321 Procedure and treatment not carried out due to patient leaving prior to being seen by health care provider: Secondary | ICD-10-CM | POA: Insufficient documentation

## 2022-10-17 DIAGNOSIS — R110 Nausea: Secondary | ICD-10-CM | POA: Insufficient documentation

## 2022-10-17 DIAGNOSIS — R197 Diarrhea, unspecified: Secondary | ICD-10-CM | POA: Diagnosis not present

## 2022-10-17 LAB — CBC
HCT: 39.3 % (ref 36.0–46.0)
Hemoglobin: 12.6 g/dL (ref 12.0–15.0)
MCH: 27.6 pg (ref 26.0–34.0)
MCHC: 32.1 g/dL (ref 30.0–36.0)
MCV: 86 fL (ref 80.0–100.0)
Platelets: 286 10*3/uL (ref 150–400)
RBC: 4.57 MIL/uL (ref 3.87–5.11)
RDW: 14.5 % (ref 11.5–15.5)
WBC: 8.4 10*3/uL (ref 4.0–10.5)
nRBC: 0 % (ref 0.0–0.2)

## 2022-10-17 LAB — BASIC METABOLIC PANEL
Anion gap: 9 (ref 5–15)
BUN: 7 mg/dL (ref 6–20)
CO2: 27 mmol/L (ref 22–32)
Calcium: 8.9 mg/dL (ref 8.9–10.3)
Chloride: 102 mmol/L (ref 98–111)
Creatinine, Ser: 0.61 mg/dL (ref 0.44–1.00)
GFR, Estimated: >60 mL/min (ref >60)
Glucose, Bld: 112 mg/dL — ABNORMAL HIGH (ref 70–99)
Potassium: 3.4 mmol/L — ABNORMAL LOW (ref 3.5–5.1)
Sodium: 138 mmol/L (ref 135–145)

## 2022-10-17 LAB — I-STAT BETA HCG BLOOD, ED (MC, WL, AP ONLY): I-stat hCG, quantitative: <5 m[IU]/mL (ref <5)

## 2022-10-17 LAB — RESP PANEL BY RT-PCR (RSV, FLU A&B, COVID)  RVPGX2
Influenza A by PCR: NEGATIVE
Influenza B by PCR: NEGATIVE
Resp Syncytial Virus by PCR: NEGATIVE
SARS Coronavirus 2 by RT PCR: NEGATIVE

## 2022-10-17 NOTE — ED Triage Notes (Signed)
Patient reports she has had dizziness for 2 weeks.  Patient denies fevers.  She denies trauma.  She is uncertain if she is pregnant.  Patient states she feels like she is drunk.  She states the sx are almost constant and worse when she closes her eye.  Patient denies any recent illness.  Patient is also reporting nausea with no vomitting and diarrhea for 1 week.  Patient is alert.  Skin is warm and dry.  Patient speech is clear.   Airway is patent.  Patient denies any one sided weakness.  No other neuro deficit noted on exam with exception of dizziness.

## 2022-10-17 NOTE — ED Provider Triage Note (Signed)
Emergency Medicine Provider Triage Evaluation Note  Felicia Yoder , a 42 y.o. female  was evaluated in triage.  Pt complains of dizziness and nausea over the last 2 weeks.  Also with diarrhea over the last week.  No abdominal pain.  States when she walks she "feels drunk".  Describes symptoms as overall constant, especially in the mornings and evenings and at work.  Denies known recent illness, however family members with recent URIs.  Denies one-sided weakness, chest pain, fevers, chills, or headache.  Review of Systems  Positive:  Negative: See above  Physical Exam  BP (!) 144/72   Pulse (!) 56   Temp 98.2 F (36.8 C)   Resp 18   SpO2 100%  Gen:   Awake, no distress   Resp:  Normal effort MSK:   Moves extremities without difficulty  Other:  Sitting comfortably.  PERRLA.  Gaze aligned appropriately.  EOMI to provider face.  Gait appears grossly intact.  No facial asymmetry.  Abdomen protuberant, soft, nontender.  Medical Decision Making  Medically screening exam initiated at 2:38 PM.  Appropriate orders placed.  Anora Schwenke was informed that the remainder of the evaluation will be completed by another provider, this initial triage assessment does not replace that evaluation, and the importance of remaining in the ED until their evaluation is complete.     Prince Rome, PA-C 19/62/22 1442
